# Patient Record
Sex: Male | Born: 1975 | Race: Black or African American | Hispanic: No | Marital: Married | Smoking: Current every day smoker
Health system: Southern US, Community
[De-identification: ages and names within clinical notes are randomized; demographics above are authoritative.]

---

## 2006-09-02 ENCOUNTER — Emergency Department: Payer: Self-pay | Admitting: General Practice

## 2009-01-13 ENCOUNTER — Emergency Department: Payer: Self-pay | Admitting: Internal Medicine

## 2009-05-18 ENCOUNTER — Emergency Department: Payer: Self-pay | Admitting: Emergency Medicine

## 2009-10-24 ENCOUNTER — Emergency Department: Payer: Self-pay | Admitting: Emergency Medicine

## 2009-12-12 ENCOUNTER — Ambulatory Visit: Payer: Self-pay | Admitting: Internal Medicine

## 2009-12-30 ENCOUNTER — Ambulatory Visit: Payer: Self-pay | Admitting: Internal Medicine

## 2010-01-09 ENCOUNTER — Ambulatory Visit: Payer: Self-pay | Admitting: Internal Medicine

## 2010-03-09 ENCOUNTER — Ambulatory Visit: Payer: Self-pay | Admitting: Otolaryngology

## 2011-06-27 ENCOUNTER — Ambulatory Visit: Payer: Self-pay | Admitting: Family Medicine

## 2011-07-18 ENCOUNTER — Emergency Department: Payer: Self-pay | Admitting: Emergency Medicine

## 2012-02-21 ENCOUNTER — Emergency Department: Payer: Self-pay | Admitting: Emergency Medicine

## 2012-02-21 LAB — COMPREHENSIVE METABOLIC PANEL
Alkaline Phosphatase: 54 U/L (ref 50–136)
BUN: 15 mg/dL (ref 7–18)
Bilirubin,Total: 0.7 mg/dL (ref 0.2–1.0)
Calcium, Total: 8.9 mg/dL (ref 8.5–10.1)
Co2: 27 mmol/L (ref 21–32)
EGFR (Non-African Amer.): >60
Osmolality: 282 (ref 275–301)
Sodium: 142 mmol/L (ref 136–145)

## 2012-02-21 LAB — URINALYSIS, COMPLETE
Bacteria: NONE SEEN
Bilirubin,UR: NEGATIVE
Glucose,UR: NEGATIVE mg/dL (ref 0–75)
Ketone: NEGATIVE
Leukocyte Esterase: NEGATIVE
Protein: NEGATIVE
RBC,UR: 1 /HPF (ref 0–5)
Squamous Epithelial: 1
WBC UR: 1 /HPF (ref 0–5)

## 2012-02-21 LAB — CBC
HCT: 44.1 % (ref 40.0–52.0)
HGB: 13.4 g/dL (ref 13.0–18.0)
MCH: 24.6 pg — ABNORMAL LOW (ref 26.0–34.0)
MCHC: 30.4 g/dL — ABNORMAL LOW (ref 32.0–36.0)
MCV: 81 fL (ref 80–100)
Platelet: 180 10*3/uL (ref 150–440)
RDW: 13.5 % (ref 11.5–14.5)

## 2012-02-21 LAB — LIPASE, BLOOD: Lipase: 140 U/L (ref 73–393)

## 2014-02-19 ENCOUNTER — Emergency Department: Payer: Self-pay | Admitting: Emergency Medicine

## 2015-12-14 ENCOUNTER — Emergency Department
Admission: EM | Admit: 2015-12-14 | Discharge: 2015-12-14 | Disposition: A | Discharge status: Home / Self Care | Payer: 59 | Attending: Emergency Medicine | Admitting: Emergency Medicine

## 2015-12-14 DIAGNOSIS — M7551 Bursitis of right shoulder: Secondary | ICD-10-CM | POA: Insufficient documentation

## 2015-12-14 DIAGNOSIS — M25511 Pain in right shoulder: Secondary | ICD-10-CM | POA: Diagnosis present

## 2015-12-14 MED ORDER — NAPROXEN 500 MG PO TABS: 500.0000 mg | ORAL_TABLET | Freq: Two times a day (BID) | ORAL | Status: DC

## 2015-12-14 MED ORDER — CYCLOBENZAPRINE HCL 10 MG PO TABS: 10.0000 mg | ORAL_TABLET | Freq: Three times a day (TID) | ORAL | Status: DC | PRN

## 2015-12-14 NOTE — ED Notes (Signed)
Pt states for past  Couple of weeks right side neck and pain and shoulder pain. Denies injury increased pain with movement

## 2015-12-14 NOTE — ED Provider Notes (Signed)
[facility] Emergency Department Provider Note  ____________________________________________  Time seen: Approximately 9:28 AM  I have reviewed the triage vital signs and the nursing notes.   HISTORY  Chief Complaint Neck Pain    HPI Nicholas Solis is a 40 y.o. male , NAD, presents to the emergency department with 2 weeks of right shoulder pain that radiates to the right side of the neck. Denies any falls, injury or trauma associated with the start of the pain. States the pain worsens when he lays on his right side. Pain improves as he begins to move the right extremity about the shoulder. Feels the pain is deep in his shoulder joint. Has not taken anything over-the-counter for his pain at this time nor has been evaluated. Has not noted any bruising, skin sores, redness or swelling.Has not had any numbness, weakness, tingling of the right upper extremity. Has not lost any range of motion of his neck nor has had any stiffness. Denies chest pain, shortness breath, visual changes nor back pain.   No past medical history on file.  There are no active problems to display for this patient.   No past surgical history on file.  Current Outpatient Rx  Name  Route  Sig  Dispense  Refill  . cyclobenzaprine (FLEXERIL) 10 MG tablet   Oral   Take 1 tablet (10 mg total) by mouth 3 (three) times daily as needed for muscle spasms.   21 tablet   0   . naproxen (NAPROSYN) 500 MG tablet   Oral   Take 1 tablet (500 mg total) by mouth 2 (two) times daily with a meal.   14 tablet   0     Allergies Review of patient's allergies indicates no known allergies.  No family history on file.  Social History Social History  Substance Use Topics  . Smoking status: Not on file  . Smokeless tobacco: Not on file  . Alcohol Use: Not on file     Review of Systems  Constitutional: No fever/chills Eyes: No visual changes.  Cardiovascular: No chest pain. Respiratory:   No shortness of breath.  Gastrointestinal: No abdominal pain.  No nausea, vomiting. Musculoskeletal: Positive right shoulder pain and right neck pain. Negative for back pain.  Skin: Negative for rash, redness, swelling, bruising, skin sores. Neurological: Negative for headaches, focal weakness or numbness. No tingling. 10-point ROS otherwise negative.  ____________________________________________   PHYSICAL EXAM:  VITAL SIGNS: ED Triage Vitals  Enc Vitals Group     BP 12/14/15 0853 136/74 mmHg     Pulse Rate 12/14/15 0853 92     Resp 12/14/15 0853 20     Temp 12/14/15 0853 98.7 F (37.1 C)     Temp Source 12/14/15 0853 Oral     SpO2 12/14/15 0853 97 %     Weight 12/14/15 0853 275 lb (124.739 kg)     Height 12/14/15 0853  (1.88 m)     Head Cir --      Peak Flow --      Pain Score 12/14/15 0854 10     Pain Loc --      Pain Edu? --      Excl. in GC? --      Constitutional: Alert and oriented. Well appearing and in no acute distress. Eyes: Conjunctivae are normal.   Head: Atraumatic. Neck: No cervical spine tenderness to palpation. Supple with full range of motion. Hematological/Lymphatic/Immunilogical: No cervical lymphadenopathy. Cardiovascular: Normal rate, regular rhythm.  Normal S1 and S2.  Good peripheral circulation. Respiratory: Normal respiratory effort without tachypnea or retractions. Lungs CTAB. Gastrointestinal: Soft and nontender. No distention. No CVA tenderness. Musculoskeletal: Positive Neer's sign on the right. Tenderness to deep palpation over the right anterior shoulder joint. Patient reports pain about the anterior right shoulder with abduction to 90. Appley test causes pain about anterior right shoulder. No edema.  No joint effusions. Neurologic:  Normal speech and language. No gross focal neurologic deficits are appreciated. Sensation about the right upper extremity grossly intact to light touch. Skin:  Skin is warm, dry and intact. No rash, skin  sores, redness, swelling noted. Psychiatric: Mood and affect are normal. Speech and behavior are normal. Patient exhibits appropriate insight and judgement.   ____________________________________________   LABS  None ____________________________________________  EKG  None ____________________________________________  RADIOLOGY  None ____________________________________________    PROCEDURES  Procedure(s) performed: None    Medications - No data to display   ____________________________________________   INITIAL IMPRESSION / ASSESSMENT AND PLAN / ED COURSE  Patient's diagnosis is consistent with acute right shoulder bursitis. Patient will be discharged home with prescriptions for naproxen and Flexeril to take as directed. Patient advised to apply warm heat to the affected area 20 minutes 3-4 times daily as needed. Patient should complete range of motion exercises as discussed and modeled 3-4 times daily. Patient is to follow-up with Dr. Rosita KeaMenz in orthopedics in 1 week if no better. Patient is given ED precautions to return to the ED for any worsening or new symptoms.      ____________________________________________  FINAL CLINICAL IMPRESSION(S) / ED DIAGNOSES  Final diagnoses:  Acute shoulder bursitis, right      NEW MEDICATIONS STARTED DURING THIS VISIT:  New Prescriptions   CYCLOBENZAPRINE (FLEXERIL) 10 MG TABLET    Take 1 tablet (10 mg total) by mouth 3 (three) times daily as needed for muscle spasms.   NAPROXEN (NAPROSYN) 500 MG TABLET    Take 1 tablet (500 mg total) by mouth 2 (two) times daily with a meal.         Hope PigeonJami L Hagler, PA-C 12/14/15 [id number]  Jene Everyobert Kinner, MD 12/14/15 1126

## 2015-12-14 NOTE — Discharge Instructions (Signed)
Bursitis Bursitis is inflammation and irritation of a bursa, which is one of the small, fluid-filled sacs that cushion and protect the moving parts of your body. These sacs are located between bones and muscles, muscle attachments, or skin areas next to bones. A bursa protects these structures from the wear and tear that results from frequent movement. An inflamed bursa causes pain and swelling. Fluid may build up inside the sac. Bursitis is most common near joints, especially the knees, elbows, hips, and shoulders. CAUSES Bursitis can be caused by:   Injury from:  A direct blow, like falling on your knee or elbow.  Overuse of a joint (repetitive stress).  Infection. This can happen if bacteria gets into a bursa through a cut or scrape near a joint.  Diseases that cause joint inflammation, such as gout and rheumatoid arthritis. RISK FACTORS You may be at risk for bursitis if you:   Have a job or hobby that involves a lot of repetitive stress on your joints.  Have a condition that weakens your body's defense system (immune system), such as diabetes, cancer, or HIV.  Lift and reach overhead often.  Kneel or lean on hard surfaces often.  Run or walk often. SIGNS AND SYMPTOMS The most common signs and symptoms of bursitis are:  Pain that gets worse when you move the affected body part or put weight on it.  Inflammation.  Stiffness. Other signs and symptoms may include:  Redness.  Tenderness.  Warmth.  Pain that continues after rest.  Fever and chills. This may occur in bursitis caused by infection. DIAGNOSIS Bursitis may be diagnosed by:   Medical history and physical exam.  MRI.  A procedure to drain fluid from the bursa with a needle (aspiration). The fluid may be checked for signs of infection or gout.  Blood tests to rule out other causes of inflammation. TREATMENT  Bursitis can usually be treated at home with rest, ice, compression, and elevation (RICE). For  mild bursitis, RICE treatment may be all you need. Other treatments may include:  Nonsteroidal anti-inflammatory drugs (NSAIDs) to treat pain and inflammation.  Corticosteroids to fight inflammation. You may have these drugs injected into and around the area of bursitis.  Aspiration of bursitis fluid to relieve pain and improve movement.  Antibiotic medicine to treat an infected bursa.  A splint, brace, or walking aid.  Physical therapy if you continue to have pain or limited movement.  Surgery to remove a damaged or infected bursa. This may be needed if you have a very bad case of bursitis or if other treatments have not worked. HOME CARE INSTRUCTIONS   Take medicines only as directed by your health care provider.  If you were prescribed an antibiotic medicine, finish it all even if you start to feel better.  Rest the affected area as directed by your health care provider.  Keep the area elevated.  Avoid activities that make pain worse.  Apply ice to the injured area:  Place ice in a plastic bag.  Place a towel between your skin and the bag.  Leave the ice on for 20 minutes, 2-3 times a day.  Use splints, braces, pads, or walking aids as directed by your health care provider.  Keep all follow-up visits as directed by your health care provider. This is important. PREVENTION   Wear knee pads if you kneel often.  Wear sturdy running or walking shoes that fit you well.  Take regular breaks from repetitive activity.  Warm  up by stretching before doing any strenuous activity.  Maintain a healthy weight or lose weight as recommended by your health care provider. Ask your health care provider if you need help.  Exercise regularly. Start any new physical activity gradually. SEEK MEDICAL CARE IF:   Your bursitis is not responding to treatment or home care.  You have a fever.  You have chills.   This information is not intended to replace advice given to you by your  health care provider. Make sure you discuss any questions you have with your health care provider.   Document Released: 07/23/2000 Document Revised: 04/16/2015 Document Reviewed: 10/15/2013 Elsevier Interactive Patient Education 2016 [employer] Inc[employer]  [employer] LockerHeat Therapy Heat therapy can help ease sore, stiff, injured, and tight muscles and joints. Heat relaxes your muscles, which may help ease your pain. Heat therapy should only be used on old, pre-existing, or long-lasting (chronic) injuries. Do not use heat therapy unless told by your doctor. HOW TO USE HEAT THERAPY There are several different kinds of heat therapy, including:  Moist heat pack.  Warm water bath.  Hot water bottle.  Electric heating pad.  Heated gel pack.  Heated wrap.  Electric heating pad. GENERAL HEAT THERAPY RECOMMENDATIONS   Do not sleep while using heat therapy. Only use heat therapy while you are awake.  Your skin may turn pink while using heat therapy. Do not use heat therapy if your skin turns red.  Do not use heat therapy if you have new pain.  High heat or long exposure to heat can cause burns. Be careful when using heat therapy to avoid burning your skin.  Do not use heat therapy on areas of your skin that are already irritated, such as with a rash or sunburn. GET HELP IF:   You have blisters, redness, swelling (puffiness), or numbness.  You have new pain.  Your pain is worse. MAKE SURE YOU:  Understand these instructions.  Will watch your condition.  Will get help right away if you are not doing well or get worse.   This information is not intended to replace advice given to you by your health care provider. Make sure you discuss any questions you have with your health care provider.   Document Released: 10/18/2011 Document Revised: 08/16/2014 Document Reviewed: 09/18/2013 Elsevier Interactive Patient Education 2016 Elsevier Inc[employer]  Generic Shoulder Exercises EXERCISES  RANGE OF MOTION  (ROM) AND STRETCHING EXERCISES These exercises may help you when beginning to rehabilitate your injury. Your symptoms may resolve with or without further involvement from your physician, physical therapist or athletic trainer. While completing these exercises, remember:   Restoring tissue flexibility helps normal motion to return to the joints. This allows healthier, less painful movement and activity.  An effective stretch should be held for at least 30 seconds.  A stretch should never be painful. You should only feel a gentle lengthening or release in the stretched tissue. ROM - Pendulum  Bend at the waist so that your right / left arm falls away from your body. Support yourself with your opposite hand on a solid surface, such as a table or a countertop.  Your right / left arm should be perpendicular to the ground. If it is not perpendicular, you need to lean over farther. Relax the muscles in your right / left arm and shoulder as much as possible.  Gently sway your hips and trunk so they move your right / left arm without any use of your right / left shoulder  muscles.  Progress your movements so that your right / left arm moves side to side, then forward and backward, and finally, both clockwise and counterclockwise.  Complete __________ repetitions in each direction. Many people use this exercise to relieve discomfort in their shoulder as well as to gain range of motion. Repeat __________ times. Complete this exercise __________ times per day. STRETCH - Flexion, Standing  Stand with good posture. With an underhand grip on your right / left hand and an overhand grip on the opposite hand, grasp a broomstick or cane so that your hands are a little more than shoulder-width apart.  Keeping your right / left elbow straight and shoulder muscles relaxed, push the stick with your opposite hand to raise your right / left arm in front of your body and then overhead. Raise your arm until you feel a  stretch in your right / left shoulder, but before you have increased shoulder pain.  Try to avoid shrugging your right / left shoulder as your arm rises by keeping your shoulder blade tucked down and toward your mid-back spine. Hold __________ seconds.  Slowly return to the starting position. Repeat __________ times. Complete this exercise __________ times per day. STRETCH - Internal Rotation  Place your right / left hand behind your back, palm-up.  Throw a towel or belt over your opposite shoulder. Grasp the towel/belt with your right / left hand.  While keeping an upright posture, gently pull up on the towel/belt until you feel a stretch in the front of your right / left shoulder.  Avoid shrugging your right / left shoulder as your arm rises by keeping your shoulder blade tucked down and toward your mid-back spine.  Hold __________. Release the stretch by lowering your opposite hand. Repeat __________ times. Complete this exercise __________ times per day. STRETCH - External Rotation and Abduction  Stagger your stance through a doorframe. It does not matter which foot is forward.  As instructed by your physician, physical therapist or athletic trainer, place your hands:  And forearms above your head and on the door frame.  And forearms at head-height and on the door frame.  At elbow-height and on the door frame.  Keeping your head and chest upright and your stomach muscles tight to prevent over-extending your low-back, slowly shift your weight onto your front foot until you feel a stretch across your chest and/or in the front of your shoulders.  Hold __________ seconds. Shift your weight to your back foot to release the stretch. Repeat __________ times. Complete this stretch __________ times per day.  STRENGTHENING EXERCISES  These exercises may help you when beginning to rehabilitate your injury. They may resolve your symptoms with or without further involvement from your  physician, physical therapist or athletic trainer. While completing these exercises, remember:   Muscles can gain both the endurance and the strength needed for everyday activities through controlled exercises.  Complete these exercises as instructed by your physician, physical therapist or athletic trainer. Progress the resistance and repetitions only as guided.  You may experience muscle soreness or fatigue, but the pain or discomfort you are trying to eliminate should never worsen during these exercises. If this pain does worsen, stop and make certain you are following the directions exactly. If the pain is still present after adjustments, discontinue the exercise until you can discuss the trouble with your clinician.  If advised by your physician, during your recovery, avoid activity or exercises which involve actions that place your right /  left hand or elbow above your head or behind your back or head. These positions stress the tissues which are trying to heal. STRENGTH - Scapular Depression and Adduction  With good posture, sit on a firm chair. Supported your arms in front of you with pillows, arm rests or a table top. Have your elbows in line with the sides of your body.  Gently draw your shoulder blades down and toward your mid-back spine. Gradually increase the tension without tensing the muscles along the top of your shoulders and the back of your neck.  Hold for __________ seconds. Slowly release the tension and relax your muscles completely before completing the next repetition.  After you have practiced this exercise, remove the arm support and complete it in standing as well as sitting. Repeat __________ times. Complete this exercise __________ times per day.  STRENGTH - External Rotators  Secure a rubber exercise band/tubing to a fixed object so that it is at the same height as your right / left elbow when you are standing or sitting on a firm surface.  Stand or sit so that  the secured exercise band/tubing is at your side that is not injured.  Bend your elbow 90 degrees. Place a folded towel or small pillow under your right / left arm so that your elbow is a few inches away from your side.  Keeping the tension on the exercise band/tubing, pull it away from your body, as if pivoting on your elbow. Be sure to keep your body steady so that the movement is only coming from your shoulder rotating.  Hold __________ seconds. Release the tension in a controlled manner as you return to the starting position. Repeat __________ times. Complete this exercise __________ times per day.  STRENGTH - Supraspinatus  Stand or sit with good posture. Grasp a __________ weight or an exercise band/tubing so that your hand is "thumbs-up," like when you shake hands.  Slowly lift your right / left hand from your thigh into the air, traveling about 30 degrees from straight out at your side. Lift your hand to shoulder height or as far as you can without increasing any shoulder pain. Initially, many people do not lift their hands above shoulder height.  Avoid shrugging your right / left shoulder as your arm rises by keeping your shoulder blade tucked down and toward your mid-back spine.  Hold for __________ seconds. Control the descent of your hand as you slowly return to your starting position. Repeat __________ times. Complete this exercise __________ times per day.  STRENGTH - Shoulder Extensors  Secure a rubber exercise band/tubing so that it is at the height of your shoulders when you are either standing or sitting on a firm arm-less chair.  With a thumbs-up grip, grasp an end of the band/tubing in each hand. Straighten your elbows and lift your hands straight in front of you at shoulder height. Step back away from the secured end of band/tubing until it becomes tense.  Squeezing your shoulder blades together, pull your hands down to the sides of your thighs. Do not allow your hands to  go behind you.  Hold for __________ seconds. Slowly ease the tension on the band/tubing as you reverse the directions and return to the starting position. Repeat __________ times. Complete this exercise __________ times per day.  STRENGTH - Scapular Retractors  Secure a rubber exercise band/tubing so that it is at the height of your shoulders when you are either standing or sitting on a firm  arm-less chair.  With a palm-down grip, grasp an end of the band/tubing in each hand. Straighten your elbows and lift your hands straight in front of you at shoulder height. Step back away from the secured end of band/tubing until it becomes tense.  Squeezing your shoulder blades together, draw your elbows back as you bend them. Keep your upper arm lifted away from your body throughout the exercise.  Hold __________ seconds. Slowly ease the tension on the band/tubing as you reverse the directions and return to the starting position. Repeat __________ times. Complete this exercise __________ times per day. STRENGTH - Scapular Depressors  Find a sturdy chair without wheels, such as a from a dining room table.  Keeping your feet on the floor, lift your bottom from the seat and lock your elbows.  Keeping your elbows straight, allow gravity to pull your body weight down. Your shoulders will rise toward your ears.  Raise your body against gravity by drawing your shoulder blades down your back, shortening the distance between your shoulders and ears. Although your feet should always maintain contact with the floor, your feet should progressively support less body weight as you get stronger.  Hold __________ seconds. In a controlled and slow manner, lower your body weight to begin the next repetition. Repeat __________ times. Complete this exercise __________ times per day.    This information is not intended to replace advice given to you by your health care provider. Make sure you discuss any questions you  have with your health care provider.   Document Released: 06/09/2005 Document Revised: 08/16/2014 Document Reviewed: 11/07/2008 Elsevier Interactive Patient Education [employer]

## 2016-05-18 ENCOUNTER — Encounter: Payer: Self-pay | Admitting: Emergency Medicine

## 2016-05-18 ENCOUNTER — Emergency Department
Admission: EM | Admit: 2016-05-18 | Discharge: 2016-05-18 | Disposition: A | Discharge status: Home / Self Care | Payer: Worker's Compensation | Attending: Emergency Medicine | Admitting: Emergency Medicine

## 2016-05-18 ENCOUNTER — Emergency Department: Payer: Worker's Compensation

## 2016-05-18 DIAGNOSIS — Y990 Civilian activity done for income or pay: Secondary | ICD-10-CM | POA: Insufficient documentation

## 2016-05-18 DIAGNOSIS — Y929 Unspecified place or not applicable: Secondary | ICD-10-CM | POA: Insufficient documentation

## 2016-05-18 DIAGNOSIS — F17200 Nicotine dependence, unspecified, uncomplicated: Secondary | ICD-10-CM | POA: Insufficient documentation

## 2016-05-18 DIAGNOSIS — Y9389 Activity, other specified: Secondary | ICD-10-CM | POA: Diagnosis not present

## 2016-05-18 DIAGNOSIS — S8391XA Sprain of unspecified site of right knee, initial encounter: Secondary | ICD-10-CM | POA: Insufficient documentation

## 2016-05-18 DIAGNOSIS — X501XXA Overexertion from prolonged static or awkward postures, initial encounter: Secondary | ICD-10-CM | POA: Diagnosis not present

## 2016-05-18 DIAGNOSIS — S8991XA Unspecified injury of right lower leg, initial encounter: Secondary | ICD-10-CM | POA: Diagnosis present

## 2016-05-18 MED ORDER — IBUPROFEN 600 MG PO TABS
600.0000 mg | ORAL_TABLET | Freq: Four times a day (QID) | ORAL | 0 refills | Status: AC | PRN
Start: 2016-05-18 — End: ?

## 2016-05-18 NOTE — ED Provider Notes (Signed)
[facility] Emergency Department Provider Note  ____________________________________________  Time seen: Approximately 8:47 AM  I have reviewed the triage vital signs and the nursing notes.   HISTORY  Chief Complaint Knee Injury    HPI Nicholas Solis is a 40 y.o. male , NAD, presents to the emergency department with one-day history of right knee pain. States he was working yesterday and his right foot slipped off of a curb causing his right knee to buckle. Denies fall or blunt trauma to the knee. Has not had any numbness, wheezes, tingling about the right lower extremity. Has not noted any swelling, redness or abnormal warmth to the knee or lower extremity. States he was able to finish his shift and while he was at home he felt his right knee "lock". Had difficulty straightening the knee without significant pain. Woke this morning with a burning sensation about the lateral portion of the right knee and difficulty straightening the knee. States the pain and range of motion has improved since waking this morning. No previous trauma to the knee. No fevers or chills. Has not completed any supportive care.   History reviewed. No pertinent past medical history.  There are no active problems to display for this patient.   History reviewed. No pertinent surgical history.  Prior to Admission medications   Medication Sig Start Date End Date Taking? Authorizing Provider  ibuprofen (ADVIL,MOTRIN) 600 MG tablet Take 1 tablet (600 mg total) by mouth every 6 (six) hours as needed. 05/18/16   Sheli Dorin L Edelin Fryer, PA-C    Allergies Review of patient's allergies indicates no known allergies.  No family history on file.  Social History Social History  Substance Use Topics  . Smoking status: Current Every Day Smoker  . Smokeless tobacco: Never Used  . Alcohol use Not on file     Review of Systems  Constitutional: No fever/chills Musculoskeletal: Positive right knee  pain with occasional "locking", decreased range of motion.  Skin: Negative for redness, swelling, abnormal warmth. Neurological: Negative for numbness, weakness, tingling.   ____________________________________________   PHYSICAL EXAM:  VITAL SIGNS: ED Triage Vitals  Enc Vitals Group     BP 05/18/16 0817 (!) 149/86     Pulse Rate 05/18/16 0817 71     Resp 05/18/16 0817 16     Temp 05/18/16 0817 98.3 F (36.8 C)     Temp Source 05/18/16 0817 Oral     SpO2 05/18/16 0817 99 %     Weight 05/18/16 0812 265 lb (120.2 kg)     Height 05/18/16 0812 6\' 2"  (1.88 m)     Head Circumference --      Peak Flow --      Pain Score 05/18/16 0812 6     Pain Loc --      Pain Edu? --      Excl. in GC? --      Constitutional: Alert and oriented. Well appearing and in no acute distress. Eyes: Conjunctivae are normal. Head: Atraumatic. Cardiovascular: Good peripheral circulation with 2+ pulses noted in the right lower extremity. Respiratory: Normal respiratory effort without tachypnea or retractions.  Musculoskeletal: Tenderness to deep palpation of the right lateral knee without bony abnormalities, step-offs or crepitus. No laxity with varus or valgus stress. No laxity with anterior or posterior drawer. Full flexion and can be achieved without pain. Extension to approximately 160 but stopped due to pain. Negative patellofemoral grinding. No lower extremity tenderness nor edema.  No joint effusions. Neurologic:  Normal speech and language. No gross focal neurologic deficits are appreciated.  Skin:  Skin is warm, dry and intact. No rash, redness, abnormal warmth, skin sores noted. Psychiatric: Mood and affect are normal. Speech and behavior are normal. Patient exhibits appropriate insight and judgement.   ____________________________________________   LABS  None ____________________________________________  EKG  None ____________________________________________  RADIOLOGY I, Madasyn Heath L  Adia Crammer, personally viewed and evaluated these images (pHope Pigeonlain radiographs) as part of my medical decision making, as well as reviewing the written report by the radiologist.  Dg Knee Complete 4 Views Right  Result Date: 05/18/2016 CLINICAL DATA:  Reports twisting right knee at work yesterday with lateral side pain and swelling. Difficult to bear weight. EXAM: RIGHT KNEE - COMPLETE 4+ VIEW COMPARISON:  None. FINDINGS: The joint is located. Bony mineralization is normal. No definite joint effusion visualize. Negative for fracture or focal bony lesion. No significant degenerative changes. Question mild subcutaneous swelling superior to the patella and lateral to the joint. IMPRESSION: No acute bony abnormality or joint effusion identified. Question mild subcutaneous swelling about the knee. Electronically Signed   By: Britta MccreedySusan  Turner M.D.   On: 05/18/2016 09:19    ____________________________________________    PROCEDURES  Procedure(s) performed: None   Procedures   Medications - No data to display   ____________________________________________   INITIAL IMPRESSION / ASSESSMENT AND PLAN / ED COURSE  Pertinent labs & imaging results that were available during my care of the patient were reviewed by me and considered in my medical decision making (see chart for details).  Clinical Course    Patient's diagnosis is consistent with sprain of right knee. Patient will be discharged home with prescriptions for Naproxen to take as directed. Light ROM exercises as tolerated. Patient was placed in an ace wrap and given crutches for supportive care. Given a work note for light duty or sit down duty for 2 days. If unable to return to work full duty in 2 days should follow up with outpatient urgent care or orthopedics as referred by [employer]. Patient is given ED precautions to return to the ED for any worsening or new symptoms.    ____________________________________________  FINAL  CLINICAL IMPRESSION(S) / ED DIAGNOSES  Final diagnoses:  Sprain of right knee, unspecified ligament, initial encounter      NEW MEDICATIONS STARTED DURING THIS VISIT:  New Prescriptions   IBUPROFEN (ADVIL,MOTRIN) 600 MG TABLET    Take 1 tablet (600 mg total) by mouth every 6 (six) hours as needed.         Hope PigeonJami L Jeanita Carneiro, PA-C 05/18/16 0940    Jeanmarie PlantJames A McShane, MD 05/18/16 [phone]

## 2016-05-18 NOTE — Discharge Instructions (Signed)
May take knee wrap off at night. Do not apply the wrap too tight, should only be supportive.   Ice x 20 minutes 3-4 times daily.   Should be able to walk without crutches within 2 days, if not, please see orthopedics.   Will need to see an outpatient urgent care for follow up if unable to return to work without restrictions in 2 days.

## 2016-05-18 NOTE — ED Notes (Addendum)
Multiple attempts to contact pt employer to get information if UDS is needed @ [phone] Ext. 3, [employer] is not available at present..Marland Kitchen

## 2016-05-18 NOTE — ED Notes (Signed)
Spoke with [employer] and was given the number for there Human resources department.. The office is currently closed and was unable to get needed information for workers comp testing prior to discharging the pt.

## 2016-05-18 NOTE — ED Triage Notes (Signed)
Reports twisting right knee at work yesterday.

## 2017-11-25 IMAGING — DX DG KNEE COMPLETE 4+V*R*
4 series · 4 of 4 positions shown · non-contrast
Comparison: None.

CLINICAL DATA: Reports twisting right knee at work yesterday with
lateral side pain and swelling. Difficult to bear weight.

EXAM:
RIGHT KNEE - COMPLETE 4+ VIEW

[knee ap]
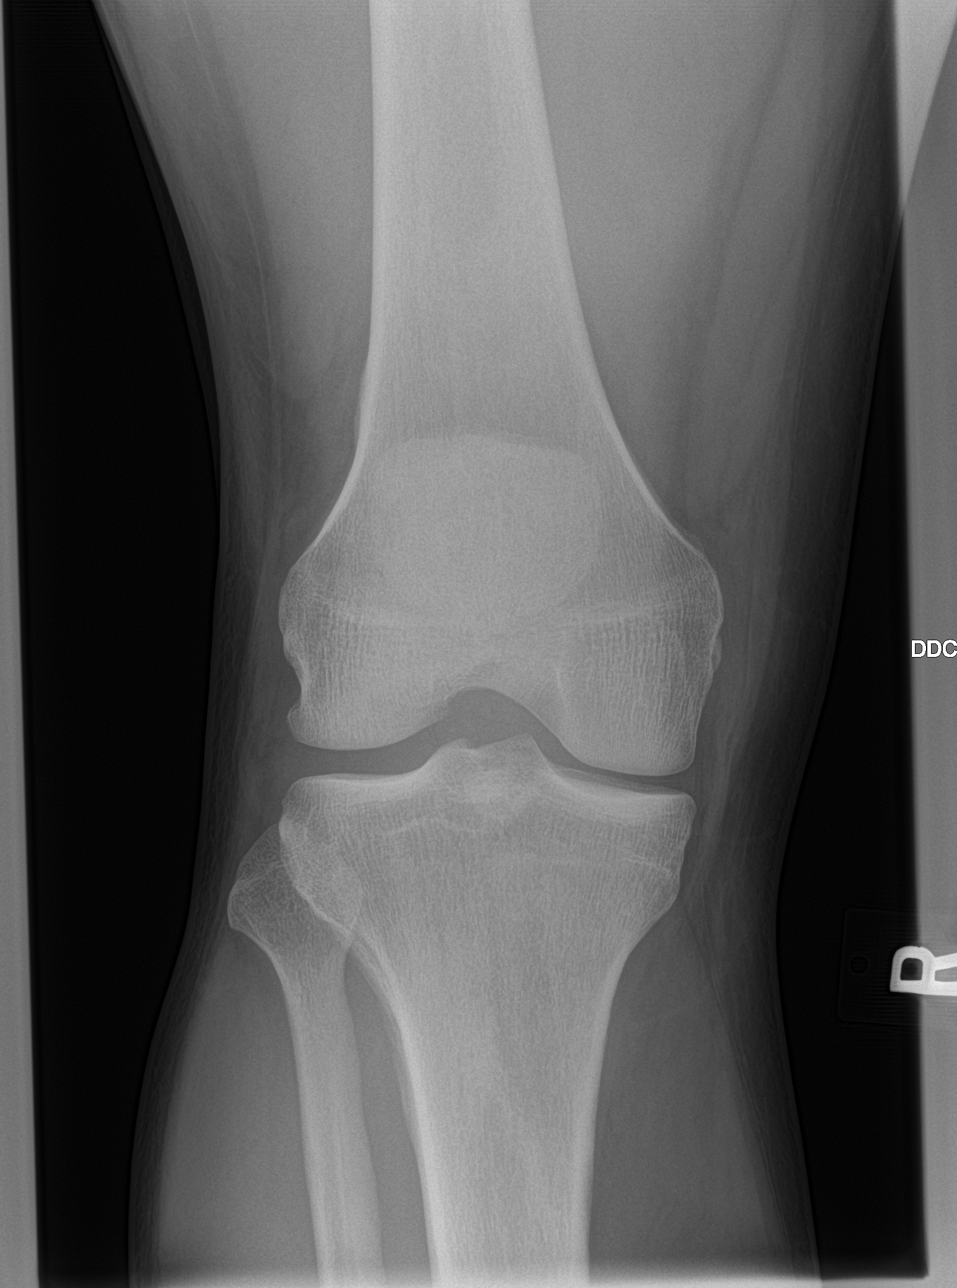

[knee tunnel]
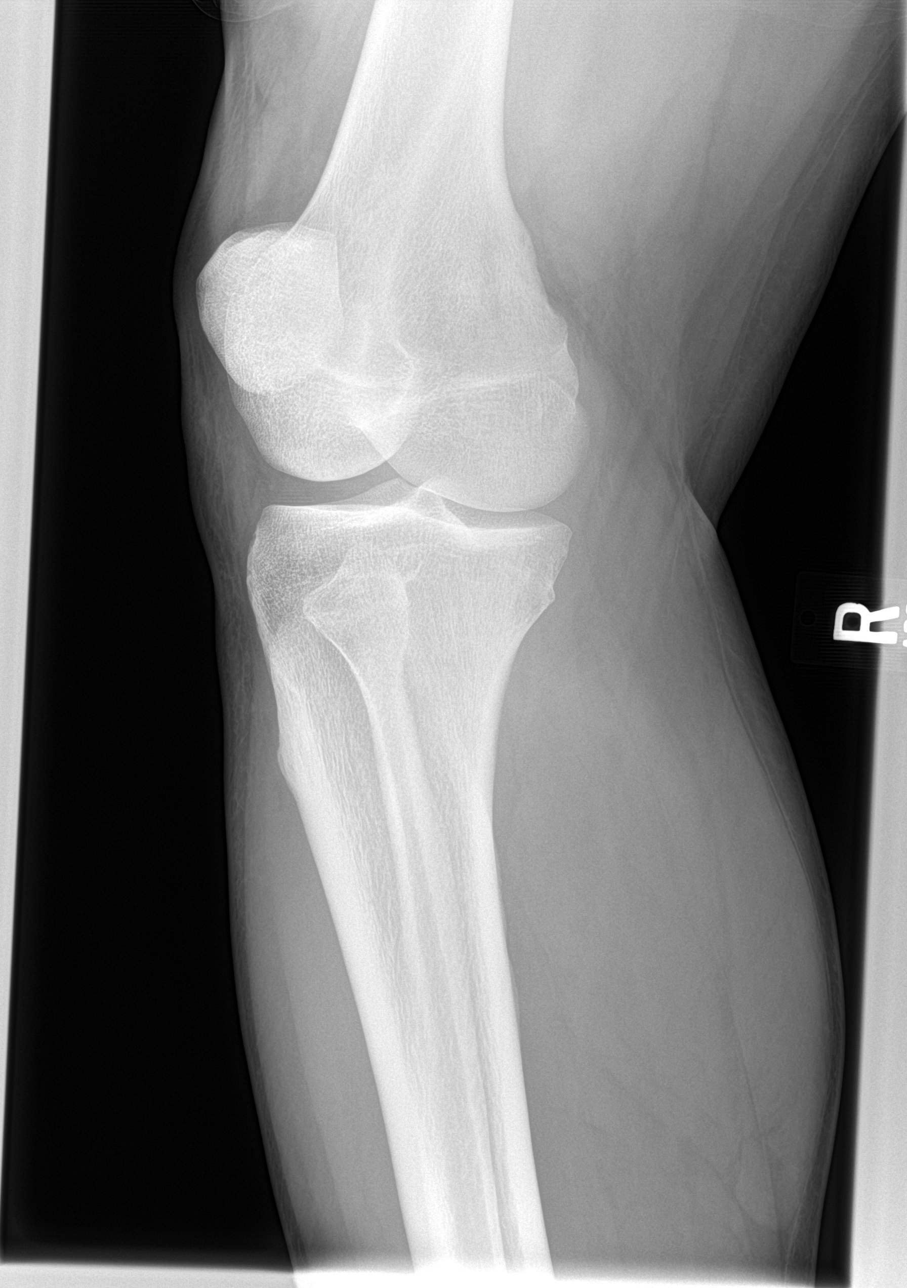

[knee lat]
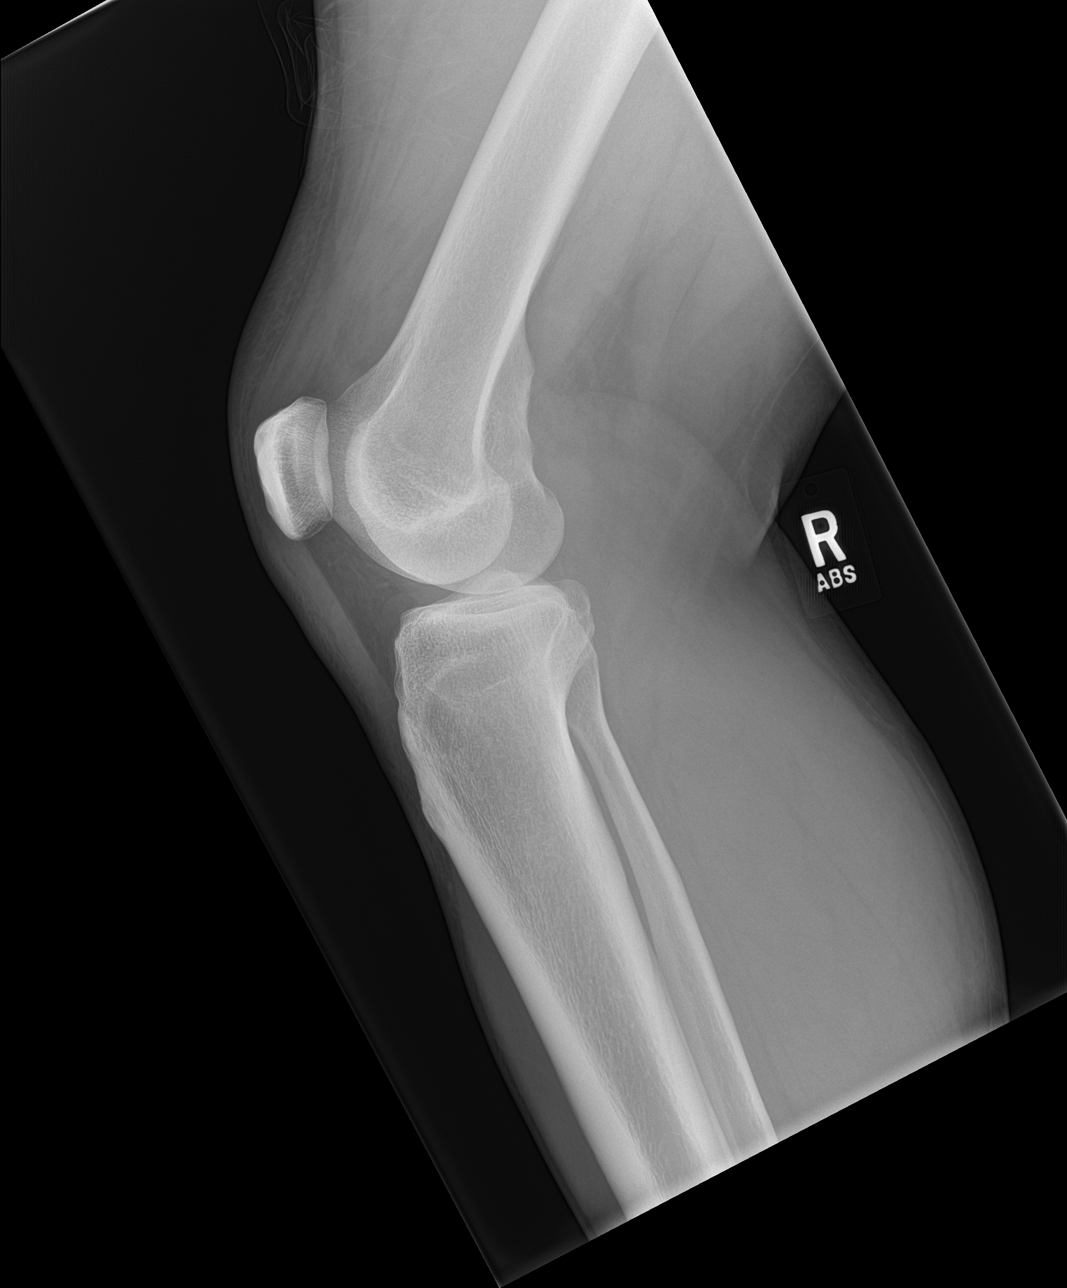

[knee obl]
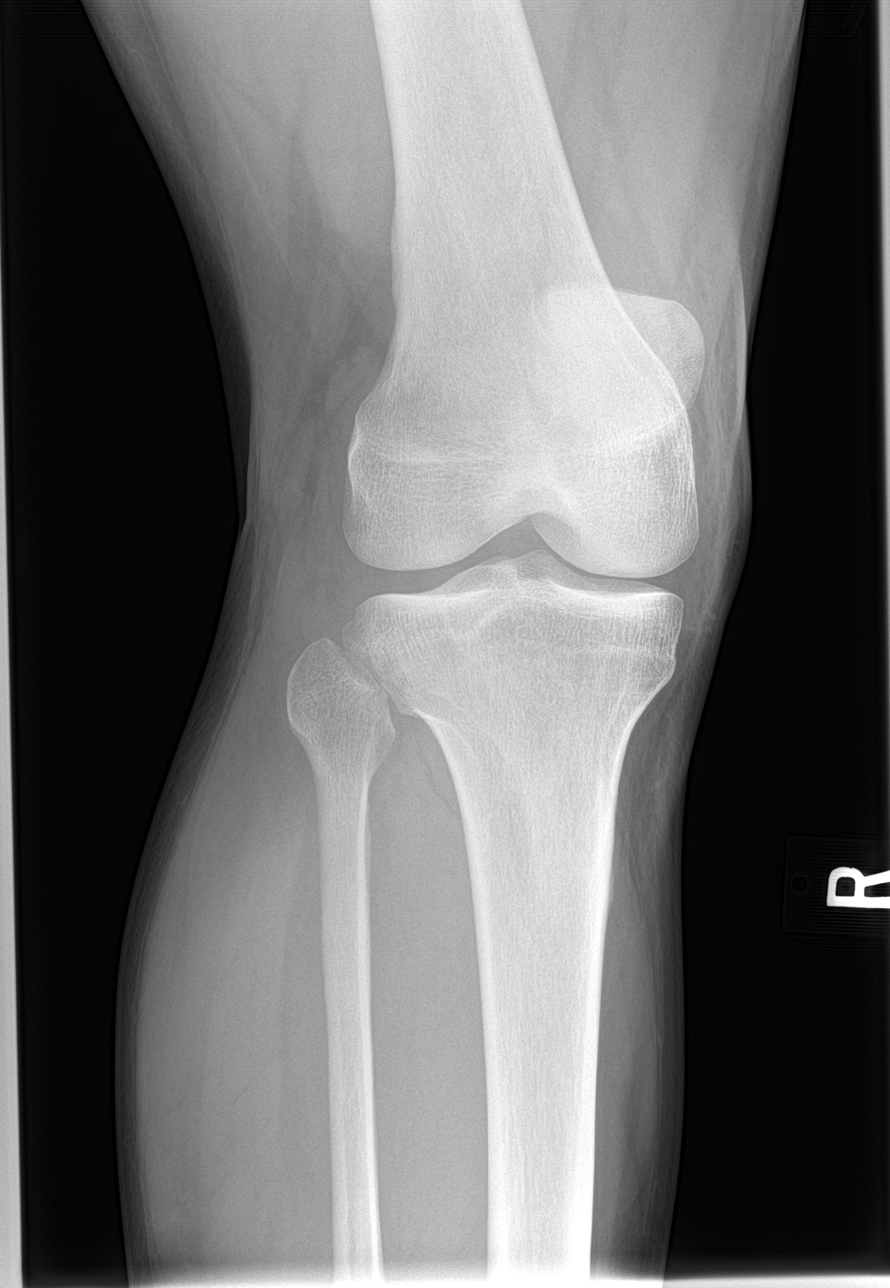

[4 of 4 positions shown; findings below may reference images not displayed]

FINDINGS: The joint is located. Bony mineralization is normal. No definite
joint effusion visualize. Negative for fracture or focal bony
lesion. No significant degenerative changes.

Question mild subcutaneous swelling superior to the patella and
lateral to the joint.
IMPRESSION: No acute bony abnormality or joint effusion identified.

Question mild subcutaneous swelling about the knee.

## 2017-12-12 ENCOUNTER — Emergency Department (HOSPITAL_COMMUNITY)
Admission: EM | Admit: 2017-12-12 | Discharge: 2017-12-13 | Disposition: A | Discharge status: Home / Self Care | Payer: 59 | Attending: Emergency Medicine | Admitting: Emergency Medicine

## 2017-12-12 ENCOUNTER — Other Ambulatory Visit: Payer: Self-pay

## 2017-12-12 DIAGNOSIS — M791 Myalgia, unspecified site: Secondary | ICD-10-CM | POA: Insufficient documentation

## 2017-12-12 DIAGNOSIS — Z5321 Procedure and treatment not carried out due to patient leaving prior to being seen by health care provider: Secondary | ICD-10-CM | POA: Diagnosis not present

## 2017-12-12 LAB — CBC
HEMATOCRIT: 43.6 % (ref 39.0–52.0)
Hemoglobin: 14.3 g/dL (ref 13.0–17.0)
MCH: 26 pg (ref 26.0–34.0)
MCHC: 32.8 g/dL (ref 30.0–36.0)
MCV: 79.1 fL (ref 78.0–100.0)
Platelets: 207 10*3/uL (ref 150–400)
RBC: 5.51 MIL/uL (ref 4.22–5.81)
RDW: 13.3 % (ref 11.5–15.5)
WBC: 9.9 10*3/uL (ref 4.0–10.5)

## 2017-12-12 LAB — URINALYSIS, ROUTINE W REFLEX MICROSCOPIC
Bilirubin Urine: NEGATIVE
GLUCOSE, UA: NEGATIVE mg/dL
Hgb urine dipstick: NEGATIVE
Ketones, ur: NEGATIVE mg/dL
LEUKOCYTES UA: NEGATIVE
Nitrite: NEGATIVE
PH: 6 (ref 5.0–8.0)
Protein, ur: NEGATIVE mg/dL
Specific Gravity, Urine: 1.001 — ABNORMAL LOW (ref 1.005–1.030)

## 2017-12-12 LAB — DIFFERENTIAL
Basophils Absolute: 0 10*3/uL (ref 0.0–0.1)
Basophils Relative: 0 %
EOS PCT: 1 %
Eosinophils Absolute: 0.1 10*3/uL (ref 0.0–0.7)
LYMPHS ABS: 3 10*3/uL (ref 0.7–4.0)
LYMPHS PCT: 30 %
MONO ABS: 0.5 10*3/uL (ref 0.1–1.0)
Monocytes Relative: 6 %
Neutro Abs: 6.2 10*3/uL (ref 1.7–7.7)
Neutrophils Relative %: 63 %

## 2017-12-12 LAB — I-STAT TROPONIN, ED: Troponin i, poc: 0 ng/mL (ref 0.00–0.08)

## 2017-12-12 NOTE — ED Triage Notes (Signed)
Patient c/o generalized body aches after getting up to use the bathroom one hour ago and states that he "just felt bad".

## 2017-12-12 NOTE — ED Notes (Signed)
In the middle of triage; patient states that he cannot wait 4-5 hours to be seen. Patient advised that I could not be able to confirm a specific timetable of when he would be seen. He states "forget this, I will just go to [facility]". Advised patient that we could begin blood work/labs while he was waiting. Patient declined.

## 2017-12-13 LAB — COMPREHENSIVE METABOLIC PANEL
ALK PHOS: 37 U/L — AB (ref 38–126)
ALT: 24 U/L (ref 17–63)
AST: 25 U/L (ref 15–41)
Albumin: 4.3 g/dL (ref 3.5–5.0)
Anion gap: 11 (ref 5–15)
BILIRUBIN TOTAL: 1.7 mg/dL — AB (ref 0.3–1.2)
BUN: 12 mg/dL (ref 6–20)
CALCIUM: 9.4 mg/dL (ref 8.9–10.3)
CO2: 27 mmol/L (ref 22–32)
Chloride: 101 mmol/L (ref 101–111)
Creatinine, Ser: 1.12 mg/dL (ref 0.61–1.24)
Glucose, Bld: 98 mg/dL (ref 65–99)
Potassium: 3.9 mmol/L (ref 3.5–5.1)
SODIUM: 139 mmol/L (ref 135–145)
TOTAL PROTEIN: 7.3 g/dL (ref 6.5–8.1)

## 2017-12-13 NOTE — ED Notes (Signed)
Patient didn't answer to assess vital signs.

## 2021-09-14 ENCOUNTER — Emergency Department
Admission: EM | Admit: 2021-09-14 | Discharge: 2021-09-14 | Disposition: A | Discharge status: Home / Self Care | Payer: 59 | Attending: Emergency Medicine | Admitting: Emergency Medicine

## 2021-09-14 ENCOUNTER — Other Ambulatory Visit: Payer: Self-pay

## 2021-09-14 DIAGNOSIS — X500XXA Overexertion from strenuous movement or load, initial encounter: Secondary | ICD-10-CM | POA: Insufficient documentation

## 2021-09-14 DIAGNOSIS — S39012A Strain of muscle, fascia and tendon of lower back, initial encounter: Secondary | ICD-10-CM | POA: Insufficient documentation

## 2021-09-14 DIAGNOSIS — M62830 Muscle spasm of back: Secondary | ICD-10-CM

## 2021-09-14 DIAGNOSIS — M5431 Sciatica, right side: Secondary | ICD-10-CM

## 2021-09-14 DIAGNOSIS — S34109A Unspecified injury to unspecified level of lumbar spinal cord, initial encounter: Secondary | ICD-10-CM | POA: Diagnosis present

## 2021-09-14 MED ORDER — ORPHENADRINE CITRATE 30 MG/ML IJ SOLN
60.0000 mg | Freq: Two times a day (BID) | INTRAMUSCULAR | Status: DC
  Administered 2021-09-14: 60 mg via INTRAVENOUS
  Filled 2021-09-14: qty 2

## 2021-09-14 MED ORDER — PREDNISONE 10 MG PO TABS: 10.0000 mg | ORAL_TABLET | Freq: Every day | ORAL | 0 refills | Status: DC

## 2021-09-14 MED ORDER — ONDANSETRON HCL 4 MG/2ML IJ SOLN
4.0000 mg | Freq: Once | INTRAMUSCULAR | Status: AC
  Administered 2021-09-14: 4 mg via INTRAVENOUS
  Filled 2021-09-14: qty 2

## 2021-09-14 MED ORDER — HYDROMORPHONE HCL 1 MG/ML IJ SOLN
1.0000 mg | Freq: Once | INTRAMUSCULAR | Status: AC
  Administered 2021-09-14: 1 mg via INTRAVENOUS
  Filled 2021-09-14: qty 1

## 2021-09-14 MED ORDER — HYDROCODONE-ACETAMINOPHEN 5-325 MG PO TABS: 1.0000 | ORAL_TABLET | ORAL | 0 refills | Status: DC | PRN

## 2021-09-14 MED ORDER — CYCLOBENZAPRINE HCL 5 MG PO TABS: 5.0000 mg | ORAL_TABLET | Freq: Three times a day (TID) | ORAL | 0 refills | Status: DC | PRN

## 2021-09-14 MED ORDER — DEXAMETHASONE SODIUM PHOSPHATE 10 MG/ML IJ SOLN
10.0000 mg | Freq: Once | INTRAMUSCULAR | Status: AC
  Administered 2021-09-14: 10 mg via INTRAVENOUS
  Filled 2021-09-14: qty 1

## 2021-09-14 NOTE — Discharge Instructions (Signed)
Please take medication as prescribed.  Avoid any heavy lifting pushing and pulling.  Return to the ER for any severe pain, weakness, loss of bowel or bladder symptoms.  Follow-up with orthopedics if no improvement in 1 week

## 2021-09-14 NOTE — ED Triage Notes (Signed)
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 Pt states 10/10.  Pt denies wishing to file for workers comp

## 2021-09-14 NOTE — ED Provider Notes (Signed)
[facility] EMERGENCY DEPARTMENT Provider Note   CSN: [id number] Arrival date & time: 09/14/21  1807     History  Chief Complaint  Patient presents with   Back Pain    Nicholas Solis is a 46 y.o. male.  Presents to the emergency department valuation of acute right lower back pain.  Patient states earlier today he was bending and lifting at work, felt pain in his lower back and burning numbness and tingling going down his right leg.  No loss of bowel or bladder symptoms.  No weakness.  He has a hard time walking because of pain in the leg because his back feels very tight.  He denies any fevers, urinary symptoms, abdominal pain chest pain or shortness of breath.  HPI     Home Medications Prior to Admission medications   Medication Sig Start Date End Date Taking? Authorizing Provider  cyclobenzaprine (FLEXERIL) 5 MG tablet Take 1-2 tablets (5-10 mg total) by mouth 3 (three) times daily as needed for muscle spasms. 09/14/21  Yes Evon Slack, PA-C  HYDROcodone-acetaminophen (NORCO) 5-325 MG tablet Take 1 tablet by mouth every 4 (four) hours as needed for moderate pain. 09/14/21  Yes Evon Slack, PA-C  predniSONE (DELTASONE) 10 MG tablet Take 1 tablet (10 mg total) by mouth daily. 6,5,4,3,2,1 six day taper 09/14/21  Yes Evon Slack, PA-C  ibuprofen (ADVIL,MOTRIN) 600 MG tablet Take 1 tablet (600 mg total) by mouth every 6 (six) hours as needed. 05/18/16   Hagler, Jami L, PA-C      Allergies    Patient has no known allergies.    Review of Systems   Review of Systems  Physical Exam Updated Vital Signs BP (!) 141/91 (BP Location: Right Arm)    Temp 98.8 F (37.1 C) (Oral)    Resp 18    Ht 6\' 2"  (1.88 m)    Wt 111.1 kg    SpO2 95%    BMI 31.46 kg/m  Physical Exam Constitutional:      Appearance: He is well-developed.  HENT:     Head: Normocephalic and atraumatic.  Eyes:     Conjunctiva/sclera: Conjunctivae normal.  Cardiovascular:     Rate  and Rhythm: Normal rate.  Pulmonary:     Effort: Pulmonary effort is normal. No respiratory distress.  Abdominal:     General: There is no distension.     Tenderness: There is no abdominal tenderness. There is no guarding.  Musculoskeletal:        General: Normal range of motion.     Cervical back: Normal range of motion.     Comments: Lumbar spine with no spinous process tenderness but mild paravertebral muscle tenderness on the right side.  No SI or sacral tenderness.  No tenderness along the iliac crest.  No sciatic notch tenderness.  He has a positive right straight leg raise test.  He has a negative logroll test and no pain with hip internal ex rotation.  He is neurovascular tact in bilateral lower extremities with good ankle plantarflexion dorsiflexion, EHL, active knee extension bilaterally.  No sensation loss throughout the lower extremities.  Skin:    General: Skin is warm.     Findings: No rash.  Neurological:     Mental Status: He is alert and oriented to person, place, and time.  Psychiatric:        Behavior: Behavior normal.        Thought Content: Thought content normal.  ED Results / Procedures / Treatments   Labs (all labs ordered are listed, but only abnormal results are displayed) Labs Reviewed - No data to display  EKG None  Radiology No results found.  Procedures Procedures    Medications Ordered in ED Medications  orphenadrine (NORFLEX) injection 60 mg (60 mg Intravenous Given 09/14/21 2038)  HYDROmorphone (DILAUDID) injection 1 mg (1 mg Intravenous Given 09/14/21 2038)  dexamethasone (DECADRON) injection 10 mg (10 mg Intravenous Given 09/14/21 2038)  ondansetron (ZOFRAN) injection 4 mg (4 mg Intravenous Given 09/14/21 2038)    ED Course/ Medical Decision Making/ A&P                           Medical Decision Making Risk Prescription drug management.   46 year old male with acute right lower back strain with muscle spasm and right lumbar  radiculopathy.  No weakness or neurological deficits.  Symptoms 50 to 75% better after IV pain medication, muscle relaxer and steroid.  Patient able to ambulate 200 feet down the hall.  He is feeling much better and feels comfortable going home.  He still has some tightness in his back but no longer having radicular symptoms down the leg.  Will send home with Norco, Flexeril and a 6-day steroid taper.  He understands signs symptoms return to the ER for such as any increasing pain weakness, loss of bowel or bladder symptoms or urgent changes in his health.  He will follow with orthopedics if no improvement 1 week.  Patient's vital signs are stable and he is ready for discharge to home   Final Clinical Impression(s) / ED Diagnoses Final diagnoses:  Strain of lumbar region, initial encounter  Sciatica of right side  Muscle spasm of back    Rx / DC Orders ED Discharge Orders          Ordered    HYDROcodone-acetaminophen (NORCO) 5-325 MG tablet  Every 4 hours PRN        09/14/21 2111    predniSONE (DELTASONE) 10 MG tablet  Daily        09/14/21 2111    cyclobenzaprine (FLEXERIL) 5 MG tablet  3 times daily PRN        09/14/21 2111              Ronnette Juniper 09/14/21 2113    Chesley Noon, MD 09/14/21 2351

## 2021-09-15 ENCOUNTER — Telehealth: Payer: Self-pay | Admitting: General Practice

## 2021-09-15 MED ORDER — HYDROCODONE-ACETAMINOPHEN 5-325 MG PO TABS: 1.0000 | ORAL_TABLET | ORAL | 0 refills | Status: DC | PRN

## 2021-09-15 NOTE — Telephone Encounter (Signed)
Patient's Norco prescription cannot be filled by [facility] as they are out of the medication.  I have switched the prescription to [facility].  Patient will be notified by the nurse.

## 2022-11-16 ENCOUNTER — Encounter: Payer: 59 | Admitting: Internal Medicine

## 2022-11-21 ENCOUNTER — Other Ambulatory Visit: Payer: Self-pay | Admitting: Internal Medicine

## 2023-03-01 ENCOUNTER — Other Ambulatory Visit: Payer: Self-pay | Admitting: Nurse Practitioner

## 2023-04-01 ENCOUNTER — Other Ambulatory Visit: Payer: Self-pay | Admitting: Internal Medicine

## 2023-05-08 ENCOUNTER — Other Ambulatory Visit: Payer: Self-pay | Admitting: Internal Medicine

## 2023-06-13 ENCOUNTER — Ambulatory Visit: Payer: 59 | Admitting: Internal Medicine

## 2023-06-13 ENCOUNTER — Encounter: Payer: Self-pay | Admitting: Internal Medicine

## 2023-06-13 VITALS — BP 128/80 | HR 75 | Ht 73.0 in | Wt 273.4 lb

## 2023-06-13 DIAGNOSIS — I10 Essential (primary) hypertension: Secondary | ICD-10-CM | POA: Insufficient documentation

## 2023-06-13 DIAGNOSIS — N400 Enlarged prostate without lower urinary tract symptoms: Secondary | ICD-10-CM

## 2023-06-13 DIAGNOSIS — Z0001 Encounter for general adult medical examination with abnormal findings: Secondary | ICD-10-CM

## 2023-06-13 MED ORDER — LISINOPRIL-HYDROCHLOROTHIAZIDE 20-12.5 MG PO TABS: 1.0000 | ORAL_TABLET | Freq: Every day | ORAL | 0 refills | Status: DC

## 2023-06-13 NOTE — Progress Notes (Addendum)
Established Patient Office Visit  Subjective:  Patient ID: Nicholas Solis, male    DOB: October 02, 1975  Age: 47 y.o. MRN: [id number]  No chief complaint on file.   No new complaints, here for BP follow up and to schedule screening colonoscopy.   No other concerns at this time.   No past medical history on file.  No past surgical history on file.  Social History   Socioeconomic History   Marital status: Married    Spouse name: Not on file   Number of children: Not on file   Years of education: Not on file   Highest education level: Not on file  Occupational History   Not on file  Tobacco Use   Smoking status: Every Day   Smokeless tobacco: Never  Substance and Sexual Activity   Alcohol use: Not on file   Drug use: Not on file   Sexual activity: Not on file  Other Topics Concern   Not on file  Social History Narrative   Not on file   Social Determinants of Health   Financial Resource Strain: Not on file  Food Insecurity: Not on file  Transportation Needs: Not on file  Physical Activity: Not on file  Stress: Not on file  Social Connections: Unknown (12/21/2021)   Received from [facility]   Social Network    Social Network: Not on file  Intimate Partner Violence: Unknown (11/12/2021)   Received from [facility]   HITS    Physically Hurt: Not on file    Insult or Talk Down To: Not on file    Threaten Physical Harm: Not on file    Scream or Curse: Not on file    No family history on file.  No Known Allergies  Review of Systems  Constitutional: Negative.   HENT: Negative.    Eyes: Negative.   Respiratory: Negative.    Cardiovascular: Negative.   Gastrointestinal: Negative.   Genitourinary: Negative.   Skin: Negative.   Neurological: Negative.   Endo/Heme/Allergies: Negative.        Objective:   BP 128/80   Pulse 75   Ht 6\' 1"  (1.854 m)   Wt 273 lb 6.4 oz (124 kg)   SpO2 98%   BMI 36.07 kg/m   Vitals:   06/13/23 1033  BP: 128/80   Pulse: 75  Height: 6\' 1"  (1.854 m)  Weight: 273 lb 6.4 oz (124 kg)  SpO2: 98%  BMI (Calculated): 36.08    Physical Exam Vitals reviewed.  Constitutional:      Appearance: Normal appearance. He is obese.  HENT:     Head: Normocephalic.     Left Ear: There is no impacted cerumen.     Nose: Nose normal.     Mouth/Throat:     Mouth: Mucous membranes are moist.     Pharynx: No posterior oropharyngeal erythema.  Eyes:     Extraocular Movements: Extraocular movements intact.     Pupils: Pupils are equal, round, and reactive to light.  Cardiovascular:     Rate and Rhythm: Regular rhythm.     Chest Wall: PMI is not displaced.     Pulses: Normal pulses.     Heart sounds: Normal heart sounds. No murmur heard. Pulmonary:     Effort: Pulmonary effort is normal.     Breath sounds: Normal air entry. No rhonchi or rales.  Abdominal:     General: Abdomen is flat. Bowel sounds are normal. There is no distension.  Palpations: Abdomen is soft. There is no hepatomegaly, splenomegaly or mass.     Tenderness: There is no abdominal tenderness.  Musculoskeletal:        General: Normal range of motion.     Cervical back: Normal range of motion and neck supple.     Right lower leg: No edema.     Left lower leg: No edema.  Skin:    General: Skin is warm and dry.  Neurological:     General: No focal deficit present.     Mental Status: He is alert and oriented to person, place, and time.     Cranial Nerves: No cranial nerve deficit.     Motor: No weakness.  Psychiatric:        Mood and Affect: Mood normal.        Behavior: Behavior normal.      No results found for any visits on 06/13/23.  No results found for this or any previous visit (from the past 2160 hour(Nicholas Solis)).    Assessment & Plan:  As per problem list. Problem List Items Addressed This Visit       Cardiovascular and Mediastinum   Primary hypertension - Primary   Relevant Medications   lisinopril-hydrochlorothiazide  (ZESTORETIC) 20-12.5 MG tablet   Other Relevant Orders   Ambulatory referral to Gastroenterology   Comprehensive metabolic panel   Lipid panel   CBC With Diff/Platelet   Other Visit Diagnoses     Benign prostatic hyperplasia without lower urinary tract symptoms       Relevant Orders   PSA   Annual visit for general adult medical examination with abnormal findings           Return in about 4 weeks (around 07/11/2023) for cpe with labs prior.   Total time spent: 20 minutes  Nicholas Fuse, MD  06/13/2023   This document may have been prepared by [facility] Voice Recognition software and as such may include unintentional dictation errors.

## 2023-06-20 DIAGNOSIS — N400 Enlarged prostate without lower urinary tract symptoms: Secondary | ICD-10-CM | POA: Insufficient documentation

## 2023-06-20 DIAGNOSIS — Z0001 Encounter for general adult medical examination with abnormal findings: Secondary | ICD-10-CM | POA: Insufficient documentation

## 2023-06-22 ENCOUNTER — Other Ambulatory Visit: Payer: 59

## 2023-06-23 LAB — CBC WITH DIFF/PLATELET
Basophils Absolute: 0 10*3/uL (ref 0.0–0.2)
Basos: 0 %
EOS (ABSOLUTE): 0.1 10*3/uL (ref 0.0–0.4)
Eos: 2 %
Hematocrit: 43.9 % (ref 37.5–51.0)
Hemoglobin: 13.9 g/dL (ref 13.0–17.7)
Immature Grans (Abs): 0 10*3/uL (ref 0.0–0.1)
Immature Granulocytes: 0 %
Lymphocytes Absolute: 2 10*3/uL (ref 0.7–3.1)
Lymphs: 34 %
MCH: 25.7 pg — ABNORMAL LOW (ref 26.6–33.0)
MCHC: 31.7 g/dL (ref 31.5–35.7)
MCV: 81 fL (ref 79–97)
Monocytes Absolute: 0.6 10*3/uL (ref 0.1–0.9)
Monocytes: 10 %
Neutrophils Absolute: 3.3 10*3/uL (ref 1.4–7.0)
Neutrophils: 54 %
Platelets: 221 10*3/uL (ref 150–450)
RBC: 5.41 x10E6/uL (ref 4.14–5.80)
RDW: 12.9 % (ref 11.6–15.4)
WBC: 6.1 10*3/uL (ref 3.4–10.8)

## 2023-06-23 LAB — COMPREHENSIVE METABOLIC PANEL
ALT: 29 [IU]/L (ref 0–44)
AST: 27 [IU]/L (ref 0–40)
Albumin: 4.5 g/dL (ref 4.1–5.1)
Alkaline Phosphatase: 46 [IU]/L (ref 44–121)
BUN/Creatinine Ratio: 20 (ref 9–20)
BUN: 21 mg/dL (ref 6–24)
Bilirubin Total: 0.4 mg/dL (ref 0.0–1.2)
CO2: 19 mmol/L — ABNORMAL LOW (ref 20–29)
Calcium: 9.1 mg/dL (ref 8.7–10.2)
Chloride: 102 mmol/L (ref 96–106)
Creatinine, Ser: 1.07 mg/dL (ref 0.76–1.27)
Globulin, Total: 2.6 g/dL (ref 1.5–4.5)
Glucose: 96 mg/dL (ref 70–99)
Potassium: 4.4 mmol/L (ref 3.5–5.2)
Sodium: 138 mmol/L (ref 134–144)
Total Protein: 7.1 g/dL (ref 6.0–8.5)
eGFR: 85 mL/min/{1.73_m2} (ref >59)

## 2023-06-23 LAB — LIPID PANEL
Chol/HDL Ratio: 3.7 ratio (ref 0.0–5.0)
Cholesterol, Total: 174 mg/dL (ref 100–199)
HDL: 47 mg/dL (ref >39)
LDL Chol Calc (NIH): 91 mg/dL (ref 0–99)
Triglycerides: 215 mg/dL — ABNORMAL HIGH (ref 0–149)
VLDL Cholesterol Cal: 36 mg/dL (ref 5–40)

## 2023-06-23 LAB — PSA: Prostate Specific Ag, Serum: 0.6 ng/mL (ref 0.0–4.0)

## 2023-07-05 ENCOUNTER — Other Ambulatory Visit: Payer: Self-pay

## 2023-07-05 DIAGNOSIS — I10 Essential (primary) hypertension: Secondary | ICD-10-CM

## 2023-07-05 MED ORDER — LISINOPRIL-HYDROCHLOROTHIAZIDE 20-12.5 MG PO TABS: 1.0000 | ORAL_TABLET | Freq: Every day | ORAL | 0 refills | Status: DC

## 2023-07-11 ENCOUNTER — Ambulatory Visit (INDEPENDENT_AMBULATORY_CARE_PROVIDER_SITE_OTHER): Payer: 59 | Admitting: Internal Medicine

## 2023-07-11 ENCOUNTER — Other Ambulatory Visit: Payer: Self-pay

## 2023-07-11 ENCOUNTER — Encounter: Payer: Self-pay | Admitting: Internal Medicine

## 2023-07-11 VITALS — BP 142/90 | HR 76 | Ht 74.0 in | Wt 223.0 lb

## 2023-07-11 DIAGNOSIS — I10 Essential (primary) hypertension: Secondary | ICD-10-CM | POA: Diagnosis not present

## 2023-07-11 DIAGNOSIS — E782 Mixed hyperlipidemia: Secondary | ICD-10-CM | POA: Insufficient documentation

## 2023-07-11 DIAGNOSIS — Z0001 Encounter for general adult medical examination with abnormal findings: Secondary | ICD-10-CM

## 2023-07-11 DIAGNOSIS — Z1331 Encounter for screening for depression: Secondary | ICD-10-CM

## 2023-07-11 NOTE — Progress Notes (Signed)
Established Patient Office Visit  Subjective:  Patient ID: Nicholas Solis, male    DOB: 07-06-1976  Age: 47 y.o. MRN: [id number]  No chief complaint on file.   No new complaints, here for CPE, lab review and medication refills. Labs reviewed and notable for well controlled lipids except elevated triglycerides. PSA and CMP unremarkable.    No other concerns at this time.   History reviewed. No pertinent past medical history.  History reviewed. No pertinent surgical history.  Social History   Socioeconomic History   Marital status: Married    Spouse name: Not on file   Number of children: 4   Years of education: Not on file   Highest education level: High school graduate  Occupational History   Occupation: [employer] doctor  Tobacco Use   Smoking status: Some Days    Types: Cigarettes   Smokeless tobacco: Never  Vaping Use   Vaping status: Never Used  Substance and Sexual Activity   Alcohol use: Yes    Alcohol/week: 6.0 standard drinks of alcohol    Types: 6 Cans of beer per week   Drug use: Never   Sexual activity: Yes  Other Topics Concern   Not on file  Social History Narrative   Not on file   Social Determinants of Health   Financial Resource Strain: Not on file  Food Insecurity: Not on file  Transportation Needs: Not on file  Physical Activity: Not on file  Stress: Not on file  Social Connections: Unknown (12/21/2021)   [facility]   Social Network    Social Network: Not on file  Intimate Partner Violence: Unknown (11/12/2021)   Received from [facility]   HITS    Physically Hurt: Not on file    Insult or Talk Down To: Not on file    Threaten Physical Harm: Not on file    Scream or Curse: Not on file    History reviewed. No pertinent family history.  No Known Allergies  Outpatient Medications Prior to Visit  Medication Sig   cetirizine (ZYRTEC) 10 MG tablet Take 10 mg by mouth daily.   cyclobenzaprine (FLEXERIL) 5 MG tablet  Take 1-2 tablets (5-10 mg total) by mouth 3 (three) times daily as needed for muscle spasms.   fluticasone (FLONASE) 50 MCG/ACT nasal spray Place 1 spray into both nostrils daily.   HYDROcodone-acetaminophen (NORCO) 5-325 MG tablet Take 1 tablet by mouth every 4 (four) hours as needed for moderate pain.   HYDROcodone-acetaminophen (NORCO/VICODIN) 5-325 MG tablet Take 1 tablet by mouth every 4 (four) hours as needed.   ibuprofen (ADVIL,MOTRIN) 600 MG tablet Take 1 tablet (600 mg total) by mouth every 6 (six) hours as needed.   lisinopril-hydrochlorothiazide (ZESTORETIC) 20-12.5 MG tablet Take 1 tablet by mouth daily.   predniSONE (DELTASONE) 10 MG tablet Take 1 tablet (10 mg total) by mouth daily. 6,5,4,3,2,1 six day taper   No facility-administered medications prior to visit.    Review of Systems  Constitutional: Negative.  Negative for weight loss.  HENT: Negative.    Eyes: Negative.   Respiratory: Negative.    Cardiovascular: Negative.   Gastrointestinal: Negative.   Genitourinary: Negative.   Musculoskeletal:  Positive for joint pain (bilat knees).  Skin: Negative.   Neurological: Negative.   Endo/Heme/Allergies: Negative.        Objective:   BP (!) 142/90   Pulse 76   Ht 6\' 2"  (1.88 m)   Wt 223 lb (101.2 kg)  SpO2 98%   BMI 28.63 kg/m   Vitals:   07/11/23 1027  BP: (!) 142/90  Pulse: 76  Height: 6\' 2"  (1.88 m)  Weight: 223 lb (101.2 kg)  SpO2: 98%  BMI (Calculated): 28.62    Physical Exam Vitals reviewed.  Constitutional:      Appearance: Normal appearance. He is obese.  HENT:     Head: Normocephalic.     Left Ear: There is no impacted cerumen.     Nose: Nose normal.     Mouth/Throat:     Mouth: Mucous membranes are moist.     Pharynx: No posterior oropharyngeal erythema.  Eyes:     Extraocular Movements: Extraocular movements intact.     Pupils: Pupils are equal, round, and reactive to light.  Cardiovascular:     Rate and Rhythm: Regular rhythm.      Chest Wall: PMI is not displaced.     Pulses: Normal pulses.     Heart sounds: Normal heart sounds. No murmur heard. Pulmonary:     Effort: Pulmonary effort is normal.     Breath sounds: Normal air entry. No rhonchi or rales.  Abdominal:     General: Abdomen is flat. Bowel sounds are normal. There is no distension.     Palpations: Abdomen is soft. There is no hepatomegaly, splenomegaly or mass.     Tenderness: There is no abdominal tenderness.  Musculoskeletal:        General: Normal range of motion.     Cervical back: Normal range of motion and neck supple.     Right lower leg: No edema.     Left lower leg: No edema.  Skin:    General: Skin is warm and dry.  Neurological:     General: No focal deficit present.     Mental Status: He is alert and oriented to person, place, and time.     Cranial Nerves: No cranial nerve deficit.     Motor: No weakness.  Psychiatric:        Mood and Affect: Mood normal.        Behavior: Behavior normal.      No results found for any visits on 07/11/23.  Recent Results (from the past 2160 hour(Desiree Fleming))  Comprehensive metabolic panel     Status: Abnormal   Collection Time: 06/22/23  8:44 AM  Result Value Ref Range   Glucose 96 70 - 99 mg/dL   BUN 21 6 - 24 mg/dL   Creatinine, Ser 4.03 0.76 - 1.27 mg/dL   eGFR 85 >47 QQ/VZD/6.38   BUN/Creatinine Ratio 20 9 - 20   Sodium 138 134 - 144 mmol/L   Potassium 4.4 3.5 - 5.2 mmol/L   Chloride 102 96 - 106 mmol/L   CO2 19 (L) 20 - 29 mmol/L   Calcium 9.1 8.7 - 10.2 mg/dL   Total Protein 7.1 6.0 - 8.5 g/dL   Albumin 4.5 4.1 - 5.1 g/dL   Globulin, Total 2.6 1.5 - 4.5 g/dL   Bilirubin Total 0.4 0.0 - 1.2 mg/dL   Alkaline Phosphatase 46 44 - 121 IU/L   AST 27 0 - 40 IU/L   ALT 29 0 - 44 IU/L  Lipid panel     Status: Abnormal   Collection Time: 06/22/23  8:44 AM  Result Value Ref Range   Cholesterol, Total 174 100 - 199 mg/dL   Triglycerides 756 (H) 0 - 149 mg/dL   HDL 47 >43 mg/dL   VLDL Cholesterol  Cal  36 5 - 40 mg/dL   LDL Chol Calc (NIH) 91 0 - 99 mg/dL   Chol/HDL Ratio 3.7 0.0 - 5.0 ratio    Comment:                                   T. Chol/HDL Ratio                                             Men  Women                               1/2 Avg.Risk  3.4    3.3                                   Avg.Risk  5.0    4.4                                2X Avg.Risk  9.6    7.1                                3X Avg.Risk 23.4   11.0   CBC With Diff/Platelet     Status: Abnormal   Collection Time: 06/22/23  8:44 AM  Result Value Ref Range   WBC 6.1 3.4 - 10.8 x10E3/uL   RBC 5.41 4.14 - 5.80 x10E6/uL   Hemoglobin 13.9 13.0 - 17.7 g/dL   Hematocrit 14.7 82.9 - 51.0 %   MCV 81 79 - 97 fL   MCH 25.7 (L) 26.6 - 33.0 pg   MCHC 31.7 31.5 - 35.7 g/dL   RDW 56.2 13.0 - 86.5 %   Platelets 221 150 - 450 x10E3/uL   Neutrophils 54 Not Estab. %   Lymphs 34 Not Estab. %   Monocytes 10 Not Estab. %   Eos 2 Not Estab. %   Basos 0 Not Estab. %   Neutrophils Absolute 3.3 1.4 - 7.0 x10E3/uL   Lymphocytes Absolute 2.0 0.7 - 3.1 x10E3/uL   Monocytes Absolute 0.6 0.1 - 0.9 x10E3/uL   EOS (ABSOLUTE) 0.1 0.0 - 0.4 x10E3/uL   Basophils Absolute 0.0 0.0 - 0.2 x10E3/uL   Immature Granulocytes 0 Not Estab. %   Immature Grans (Abs) 0.0 0.0 - 0.1 x10E3/uL  PSA     Status: None   Collection Time: 06/22/23  8:44 AM  Result Value Ref Range   Prostate Specific Ag, Serum 0.6 0.0 - 4.0 ng/mL    Comment: Roche ECLIA methodology. According to the American Urological Association, Serum PSA should decrease and remain at undetectable levels after radical prostatectomy. The AUA defines biochemical recurrence as an initial PSA value 0.2 ng/mL or greater followed by a subsequent confirmatory PSA value 0.2 ng/mL or greater. Values obtained with different assay methods or kits cannot be used interchangeably. Results cannot be interpreted as absolute evidence of the presence or absence of malignant disease.        Assessment & Plan:  As per problem list. Stricter low calorie diet, low cholesterol and low fat diet and exercise as  much as possible.  Problem List Items Addressed This Visit       Cardiovascular and Mediastinum   Primary hypertension - Primary     Other   Annual visit for general adult medical examination with abnormal findings   Mixed hyperlipidemia    Return in about 3 weeks (around 08/01/2023) for BP followup.   Total time spent: 30 minutes  Luna Fuse, MD  07/11/2023   This document may have been prepared by [facility] Voice Recognition software and as such may include unintentional dictation errors.

## 2023-08-05 ENCOUNTER — Ambulatory Visit: Payer: 59 | Admitting: Internal Medicine

## 2023-08-19 ENCOUNTER — Other Ambulatory Visit: Payer: Self-pay

## 2023-08-19 DIAGNOSIS — I10 Essential (primary) hypertension: Secondary | ICD-10-CM

## 2023-08-20 ENCOUNTER — Other Ambulatory Visit: Payer: Self-pay | Admitting: Internal Medicine

## 2023-08-20 DIAGNOSIS — I10 Essential (primary) hypertension: Secondary | ICD-10-CM

## 2023-08-22 ENCOUNTER — Telehealth: Payer: Self-pay | Admitting: Internal Medicine

## 2023-08-22 ENCOUNTER — Other Ambulatory Visit: Payer: Self-pay

## 2023-08-22 DIAGNOSIS — I10 Essential (primary) hypertension: Secondary | ICD-10-CM

## 2023-08-22 NOTE — Telephone Encounter (Signed)
 Request sent to PCP for refill.

## 2023-08-22 NOTE — Telephone Encounter (Signed)
 Pt needs a refill on his lisinopril, sent to [facility] in [place]

## 2023-08-23 ENCOUNTER — Telehealth: Payer: Self-pay | Admitting: Internal Medicine

## 2023-08-23 ENCOUNTER — Other Ambulatory Visit: Payer: Self-pay | Admitting: Internal Medicine

## 2023-08-23 DIAGNOSIS — I10 Essential (primary) hypertension: Secondary | ICD-10-CM

## 2023-08-23 MED ORDER — LISINOPRIL-HYDROCHLOROTHIAZIDE 20-12.5 MG PO TABS: 1.0000 | ORAL_TABLET | Freq: Every day | ORAL | 0 refills | Status: DC

## 2023-08-23 NOTE — Telephone Encounter (Signed)
 Entered in error

## 2023-08-23 NOTE — Telephone Encounter (Signed)
 Pt is out of town in [place]  for work, states that he forgot about his last appt then had to go out of town. Pt has been out of his LISINOPRIL  for a week.  Pt is asking if you could refill his LISINOPRIL  and his wife could pick it up, as she is leaving to go to [place]  to meet him this Friday. Pt states that he will be back in town at the end of this month & could come in for an appt. Told pt I would call him and let him know what you say  Thank you

## 2023-12-07 ENCOUNTER — Ambulatory Visit: Payer: Self-pay | Admitting: Internal Medicine

## 2023-12-07 ENCOUNTER — Other Ambulatory Visit: Payer: Self-pay

## 2023-12-07 ENCOUNTER — Other Ambulatory Visit

## 2023-12-07 DIAGNOSIS — N400 Enlarged prostate without lower urinary tract symptoms: Secondary | ICD-10-CM

## 2023-12-07 DIAGNOSIS — I10 Essential (primary) hypertension: Secondary | ICD-10-CM

## 2023-12-07 MED ORDER — LISINOPRIL-HYDROCHLOROTHIAZIDE 20-12.5 MG PO TABS: 1.0000 | ORAL_TABLET | Freq: Every day | ORAL | 0 refills | Status: DC

## 2023-12-08 LAB — LIPID PANEL
Chol/HDL Ratio: 3.2 ratio (ref 0.0–5.0)
Cholesterol, Total: 187 mg/dL (ref 100–199)
HDL: 59 mg/dL (ref >39)
LDL Chol Calc (NIH): 109 mg/dL — ABNORMAL HIGH (ref 0–99)
Triglycerides: 104 mg/dL (ref 0–149)
VLDL Cholesterol Cal: 19 mg/dL (ref 5–40)

## 2023-12-08 LAB — CBC WITH DIFF/PLATELET
Basophils Absolute: 0 10*3/uL (ref 0.0–0.2)
Basos: 1 %
EOS (ABSOLUTE): 0.1 10*3/uL (ref 0.0–0.4)
Eos: 2 %
Hematocrit: 43.4 % (ref 37.5–51.0)
Hemoglobin: 13.9 g/dL (ref 13.0–17.7)
Immature Grans (Abs): 0 10*3/uL (ref 0.0–0.1)
Immature Granulocytes: 0 %
Lymphocytes Absolute: 1.8 10*3/uL (ref 0.7–3.1)
Lymphs: 35 %
MCH: 25.3 pg — ABNORMAL LOW (ref 26.6–33.0)
MCHC: 32 g/dL (ref 31.5–35.7)
MCV: 79 fL (ref 79–97)
Monocytes Absolute: 0.5 10*3/uL (ref 0.1–0.9)
Monocytes: 10 %
Neutrophils Absolute: 2.7 10*3/uL (ref 1.4–7.0)
Neutrophils: 52 %
Platelets: 208 10*3/uL (ref 150–450)
RBC: 5.5 x10E6/uL (ref 4.14–5.80)
RDW: 12.8 % (ref 11.6–15.4)
WBC: 5.2 10*3/uL (ref 3.4–10.8)

## 2023-12-08 LAB — COMPREHENSIVE METABOLIC PANEL WITH GFR
ALT: 27 IU/L (ref 0–44)
AST: 32 IU/L (ref 0–40)
Albumin: 4.8 g/dL (ref 4.1–5.1)
Alkaline Phosphatase: 54 IU/L (ref 44–121)
BUN/Creatinine Ratio: 15 (ref 9–20)
BUN: 16 mg/dL (ref 6–24)
Bilirubin Total: 0.7 mg/dL (ref 0.0–1.2)
CO2: 23 mmol/L (ref 20–29)
Calcium: 9.5 mg/dL (ref 8.7–10.2)
Chloride: 101 mmol/L (ref 96–106)
Creatinine, Ser: 1.08 mg/dL (ref 0.76–1.27)
Globulin, Total: 2.5 g/dL (ref 1.5–4.5)
Glucose: 97 mg/dL (ref 70–99)
Potassium: 4.4 mmol/L (ref 3.5–5.2)
Sodium: 138 mmol/L (ref 134–144)
Total Protein: 7.3 g/dL (ref 6.0–8.5)
eGFR: 84 mL/min/{1.73_m2} (ref >59)

## 2023-12-08 LAB — PSA: Prostate Specific Ag, Serum: 0.7 ng/mL (ref 0.0–4.0)

## 2023-12-20 ENCOUNTER — Ambulatory Visit: Payer: Self-pay | Admitting: Internal Medicine

## 2023-12-20 ENCOUNTER — Encounter: Payer: Self-pay | Admitting: Internal Medicine

## 2023-12-20 VITALS — BP 140/82 | HR 80 | Ht 74.0 in | Wt 269.0 lb

## 2023-12-20 DIAGNOSIS — I10 Essential (primary) hypertension: Secondary | ICD-10-CM

## 2023-12-20 DIAGNOSIS — E782 Mixed hyperlipidemia: Secondary | ICD-10-CM

## 2023-12-20 MED ORDER — LISINOPRIL-HYDROCHLOROTHIAZIDE 20-12.5 MG PO TABS: 1.0000 | ORAL_TABLET | Freq: Every day | ORAL | 0 refills | Status: DC

## 2023-12-20 NOTE — Progress Notes (Signed)
 Established Patient Office Visit  Subjective:  Patient ID: Nicholas Solis, male    DOB: 1976/03/22  Age: 48 y.o. MRN: [id number]  Chief Complaint  Patient presents with   Follow-up    Follow up rx refills    No new complaints, here for lab review and medication refills.Labs reviewed and notable for deterioration in chol although triglycerides have improved.    No other concerns at this time.   No past medical history on file.  No past surgical history on file.  Social History   Socioeconomic History   Marital status: Married    Spouse name: Not on file   Number of children: 4   Years of education: Not on file   Highest education level: High school graduate  Occupational History   Occupation: [employer] doctor  Tobacco Use   Smoking status: Some Days    Types: Cigarettes   Smokeless tobacco: Never  Vaping Use   Vaping status: Never Used  Substance and Sexual Activity   Alcohol use: Yes    Alcohol/week: 6.0 standard drinks of alcohol    Types: 6 Cans of beer per week   Drug use: Never   Sexual activity: Yes  Other Topics Concern   Not on file  Social History Narrative   Not on file   Social Drivers of Health   Financial Resource Strain: Not on file  Food Insecurity: Not on file  Transportation Needs: Not on file  Physical Activity: Not on file  Stress: Not on file  Social Connections: Unknown (12/21/2021)   Received from [facility]   Social Network    Social Network: Not on file  Intimate Partner Violence: Unknown (11/12/2021)   Received from [facility]   HITS    Physically Hurt: Not on file    Insult or Talk Down To: Not on file    Threaten Physical Harm: Not on file    Scream or Curse: Not on file    No family history on file.  No Known Allergies  Outpatient Medications Prior to Visit  Medication Sig   [DISCONTINUED] lisinopril -hydrochlorothiazide  (ZESTORETIC ) 20-12.5 MG tablet Take 1 tablet by mouth daily.   cetirizine (ZYRTEC) 10 MG  tablet Take 10 mg by mouth daily. (Patient not taking: Reported on 12/20/2023)   cyclobenzaprine  (FLEXERIL ) 5 MG tablet Take 1-2 tablets (5-10 mg total) by mouth 3 (three) times daily as needed for muscle spasms. (Patient not taking: Reported on 12/20/2023)   fluticasone (FLONASE) 50 MCG/ACT nasal spray Place 1 spray into both nostrils daily. (Patient not taking: Reported on 12/20/2023)   HYDROcodone -acetaminophen  (NORCO) 5-325 MG tablet Take 1 tablet by mouth every 4 (four) hours as needed for moderate pain. (Patient not taking: Reported on 12/20/2023)   HYDROcodone -acetaminophen  (NORCO/VICODIN) 5-325 MG tablet Take 1 tablet by mouth every 4 (four) hours as needed. (Patient not taking: Reported on 12/20/2023)   ibuprofen  (ADVIL ,MOTRIN ) 600 MG tablet Take 1 tablet (600 mg total) by mouth every 6 (six) hours as needed. (Patient not taking: Reported on 12/20/2023)   predniSONE  (DELTASONE ) 10 MG tablet Take 1 tablet (10 mg total) by mouth daily. 6,5,4,3,2,1 six day taper (Patient not taking: Reported on 12/20/2023)   No facility-administered medications prior to visit.    Review of Systems  Constitutional: Negative.  Negative for weight loss.  HENT: Negative.    Eyes: Negative.   Respiratory: Negative.    Cardiovascular: Negative.   Gastrointestinal: Negative.   Genitourinary: Negative.   Musculoskeletal:  Positive for  joint pain (bilat knees).  Skin: Negative.   Neurological: Negative.   Endo/Heme/Allergies: Negative.        Objective:   BP (!) 140/82   Pulse 80   Ht 6\' 2"  (1.88 m)   Wt 269 lb (122 kg)   SpO2 97%   BMI 34.54 kg/m   Vitals:   12/20/23 1540  BP: (!) 140/82  Pulse: 80  Height: 6\' 2"  (1.88 m)  Weight: 269 lb (122 kg)  SpO2: 97%  BMI (Calculated): 34.52    Physical Exam Vitals reviewed.  Constitutional:      Appearance: Normal appearance. He is obese.  HENT:     Head: Normocephalic.     Left Ear: There is no impacted cerumen.     Nose: Nose normal.      Mouth/Throat:     Mouth: Mucous membranes are moist.     Pharynx: No posterior oropharyngeal erythema.  Eyes:     Extraocular Movements: Extraocular movements intact.     Pupils: Pupils are equal, round, and reactive to light.  Cardiovascular:     Rate and Rhythm: Regular rhythm.     Chest Wall: PMI is not displaced.     Pulses: Normal pulses.     Heart sounds: Normal heart sounds. No murmur heard. Pulmonary:     Effort: Pulmonary effort is normal.     Breath sounds: Normal air entry. No rhonchi or rales.  Abdominal:     General: Abdomen is flat. Bowel sounds are normal. There is no distension.     Palpations: Abdomen is soft. There is no hepatomegaly, splenomegaly or mass.     Tenderness: There is no abdominal tenderness.  Musculoskeletal:        General: Normal range of motion.     Cervical back: Normal range of motion and neck supple.     Right lower leg: No edema.     Left lower leg: No edema.  Skin:    General: Skin is warm and dry.  Neurological:     General: No focal deficit present.     Mental Status: He is alert and oriented to person, place, and time.     Cranial Nerves: No cranial nerve deficit.     Motor: No weakness.  Psychiatric:        Mood and Affect: Mood normal.        Behavior: Behavior normal.      No results found for any visits on 12/20/23.  Recent Results (from the past 2160 hours)  Comprehensive metabolic panel     Status: None   Collection Time: 12/07/23  8:18 AM  Result Value Ref Range   Glucose 97 70 - 99 mg/dL   BUN 16 6 - 24 mg/dL   Creatinine, Ser 4.54 0.76 - 1.27 mg/dL   eGFR 84 >09 WJ/XBJ/4.78   BUN/Creatinine Ratio 15 9 - 20   Sodium 138 134 - 144 mmol/L   Potassium 4.4 3.5 - 5.2 mmol/L   Chloride 101 96 - 106 mmol/L   CO2 23 20 - 29 mmol/L   Calcium 9.5 8.7 - 10.2 mg/dL   Total Protein 7.3 6.0 - 8.5 g/dL   Albumin 4.8 4.1 - 5.1 g/dL   Globulin, Total 2.5 1.5 - 4.5 g/dL   Bilirubin Total 0.7 0.0 - 1.2 mg/dL   Alkaline  Phosphatase 54 44 - 121 IU/L   AST 32 0 - 40 IU/L   ALT 27 0 - 44 IU/L  Lipid panel  Status: Abnormal   Collection Time: 12/07/23  8:18 AM  Result Value Ref Range   Cholesterol, Total 187 100 - 199 mg/dL   Triglycerides 161 0 - 149 mg/dL   HDL 59 >09 mg/dL   VLDL Cholesterol Cal 19 5 - 40 mg/dL   LDL Chol Calc (NIH) 604 (H) 0 - 99 mg/dL   Chol/HDL Ratio 3.2 0.0 - 5.0 ratio    Comment:                                   T. Chol/HDL Ratio                                             Men  Women                               1/2 Avg.Risk  3.4    3.3                                   Avg.Risk  5.0    4.4                                2X Avg.Risk  9.6    7.1                                3X Avg.Risk 23.4   11.0   CBC With Diff/Platelet     Status: Abnormal   Collection Time: 12/07/23  8:18 AM  Result Value Ref Range   WBC 5.2 3.4 - 10.8 x10E3/uL   RBC 5.50 4.14 - 5.80 x10E6/uL   Hemoglobin 13.9 13.0 - 17.7 g/dL   Hematocrit 54.0 98.1 - 51.0 %   MCV 79 79 - 97 fL   MCH 25.3 (L) 26.6 - 33.0 pg   MCHC 32.0 31.5 - 35.7 g/dL   RDW 19.1 47.8 - 29.5 %   Platelets 208 150 - 450 x10E3/uL   Neutrophils 52 Not Estab. %   Lymphs 35 Not Estab. %   Monocytes 10 Not Estab. %   Eos 2 Not Estab. %   Basos 1 Not Estab. %   Neutrophils Absolute 2.7 1.4 - 7.0 x10E3/uL   Lymphocytes Absolute 1.8 0.7 - 3.1 x10E3/uL   Monocytes Absolute 0.5 0.1 - 0.9 x10E3/uL   EOS (ABSOLUTE) 0.1 0.0 - 0.4 x10E3/uL   Basophils Absolute 0.0 0.0 - 0.2 x10E3/uL   Immature Granulocytes 0 Not Estab. %   Immature Grans (Abs) 0.0 0.0 - 0.1 x10E3/uL  PSA     Status: None   Collection Time: 12/07/23  8:18 AM  Result Value Ref Range   Prostate Specific Ag, Serum 0.7 0.0 - 4.0 ng/mL    Comment: Roche ECLIA methodology. According to the American Urological Association, Serum PSA should decrease and remain at undetectable levels after radical prostatectomy. The AUA defines biochemical recurrence as an initial PSA value  0.2 ng/mL or greater followed by a subsequent confirmatory PSA value 0.2 ng/mL or greater. Values obtained with different assay methods or kits cannot be  used interchangeably. Results cannot be interpreted as absolute evidence of the presence or absence of malignant disease.       Assessment & Plan:  As per problem list. Stricter low calorie diet, low cholesterol and low fat diet and exercise as much as possible.  Problem List Items Addressed This Visit       Cardiovascular and Mediastinum   Primary hypertension   Relevant Medications   lisinopril -hydrochlorothiazide  (ZESTORETIC ) 20-12.5 MG tablet    Return in about 3 months (around 03/21/2024) for BP followup, fu with labs prior.   Total time spent: 20 minutes  Arzella Bitters, MD  12/20/2023   This document may have been prepared by [facility] Voice Recognition software and as such may include unintentional dictation errors.

## 2024-03-30 ENCOUNTER — Ambulatory Visit: Admitting: Internal Medicine

## 2024-03-30 VITALS — BP 148/88 | HR 75 | Temp 98.3°F | Ht 74.0 in | Wt 281.2 lb

## 2024-03-30 DIAGNOSIS — E782 Mixed hyperlipidemia: Secondary | ICD-10-CM | POA: Diagnosis not present

## 2024-03-30 DIAGNOSIS — I10 Essential (primary) hypertension: Secondary | ICD-10-CM | POA: Diagnosis not present

## 2024-03-30 MED ORDER — OLMESARTAN MEDOXOMIL-HCTZ 40-12.5 MG PO TABS: 1.0000 | ORAL_TABLET | Freq: Every day | ORAL | 0 refills | Status: DC

## 2024-03-30 NOTE — Progress Notes (Signed)
 Established Patient Office Visit  Subjective:  Patient ID: Nicholas Solis, male    DOB: 08/28/1975  Age: 48 y.o. MRN: [id number]  Chief Complaint  Patient presents with   Follow-up    3 month follow up lab results and check bp     No new complaints, here for lab review and medication refills. Gained 12 lbs and bp elevated today.    No other concerns at this time.   No past medical history on file.  No past surgical history on file.  Social History   Socioeconomic History   Marital status: Married    Spouse name: Not on file   Number of children: 4   Years of education: Not on file   Highest education level: High school graduate  Occupational History   Occupation: [employer] doctor  Tobacco Use   Smoking status: Some Days    Types: Cigarettes   Smokeless tobacco: Never  Vaping Use   Vaping status: Never Used  Substance and Sexual Activity   Alcohol use: Yes    Alcohol/week: 6.0 standard drinks of alcohol    Types: 6 Cans of beer per week   Drug use: Never   Sexual activity: Yes  Other Topics Concern   Not on file  Social History Narrative   Not on file   Social Drivers of Health   Financial Resource Strain: Not on file  Food Insecurity: Not on file  Transportation Needs: Not on file  Physical Activity: Not on file  Stress: Not on file  Social Connections: Unknown (12/21/2021)   Received from [facility]   Social Network    Social Network: Not on file  Intimate Partner Violence: Unknown (11/12/2021)   Received from [facility]   HITS    Physically Hurt: Not on file    Insult or Talk Down To: Not on file    Threaten Physical Harm: Not on file    Scream or Curse: Not on file    No family history on file.  No Known Allergies  Outpatient Medications Prior to Visit  Medication Sig   [DISCONTINUED] lisinopril -hydrochlorothiazide  (ZESTORETIC ) 20-12.5 MG tablet Take 1 tablet by mouth daily.   cetirizine (ZYRTEC) 10 MG tablet Take 10 mg by mouth  daily. (Patient not taking: Reported on 03/30/2024)   cyclobenzaprine  (FLEXERIL ) 5 MG tablet Take 1-2 tablets (5-10 mg total) by mouth 3 (three) times daily as needed for muscle spasms. (Patient not taking: Reported on 03/30/2024)   fluticasone (FLONASE) 50 MCG/ACT nasal spray Place 1 spray into both nostrils daily. (Patient not taking: Reported on 03/30/2024)   HYDROcodone -acetaminophen  (NORCO) 5-325 MG tablet Take 1 tablet by mouth every 4 (four) hours as needed for moderate pain. (Patient not taking: Reported on 03/30/2024)   HYDROcodone -acetaminophen  (NORCO/VICODIN) 5-325 MG tablet Take 1 tablet by mouth every 4 (four) hours as needed. (Patient not taking: Reported on 03/30/2024)   ibuprofen  (ADVIL ,MOTRIN ) 600 MG tablet Take 1 tablet (600 mg total) by mouth every 6 (six) hours as needed. (Patient not taking: Reported on 03/30/2024)   predniSONE  (DELTASONE ) 10 MG tablet Take 1 tablet (10 mg total) by mouth daily. 6,5,4,3,2,1 six day taper (Patient not taking: Reported on 03/30/2024)   No facility-administered medications prior to visit.    Review of Systems  Constitutional: Negative.  Negative for weight loss.  HENT: Negative.    Eyes: Negative.   Respiratory: Negative.    Cardiovascular: Negative.   Gastrointestinal: Negative.   Genitourinary: Negative.   Musculoskeletal:  Positive for joint pain (bilat knees).  Skin: Negative.   Neurological: Negative.   Endo/Heme/Allergies: Negative.        Objective:   BP (!) 148/88 (Cuff Size: Large)   Pulse 75   Temp 98.3 F (36.8 C)   Ht 6' 2 (1.88 m)   Wt 281 lb 3.2 oz (127.6 kg)   SpO2 96%   BMI 36.10 kg/m   Vitals:   03/30/24 1432 03/30/24 1507  BP: (!) 150/94 (!) 148/88  Pulse: 75   Temp: 98.3 F (36.8 C)   Height: 6' 2 (1.88 m)   Weight: 281 lb 3.2 oz (127.6 kg)   SpO2: 96%   BMI (Calculated): 36.09     Physical Exam Vitals reviewed.  Constitutional:      Appearance: Normal appearance. He is obese.  HENT:     Head:  Normocephalic.     Left Ear: There is no impacted cerumen.     Nose: Nose normal.     Mouth/Throat:     Mouth: Mucous membranes are moist.     Pharynx: No posterior oropharyngeal erythema.  Eyes:     Extraocular Movements: Extraocular movements intact.     Pupils: Pupils are equal, round, and reactive to light.  Cardiovascular:     Rate and Rhythm: Regular rhythm.     Chest Wall: PMI is not displaced.     Pulses: Normal pulses.     Heart sounds: Normal heart sounds. No murmur heard. Pulmonary:     Effort: Pulmonary effort is normal.     Breath sounds: Normal air entry. No rhonchi or rales.  Abdominal:     General: Abdomen is flat. Bowel sounds are normal. There is no distension.     Palpations: Abdomen is soft. There is no hepatomegaly, splenomegaly or mass.     Tenderness: There is no abdominal tenderness.  Musculoskeletal:        General: Normal range of motion.     Cervical back: Normal range of motion and neck supple.     Right lower leg: No edema.     Left lower leg: No edema.  Skin:    General: Skin is warm and dry.  Neurological:     General: No focal deficit present.     Mental Status: He is alert and oriented to person, place, and time.     Cranial Nerves: No cranial nerve deficit.     Motor: No weakness.  Psychiatric:        Mood and Affect: Mood normal.        Behavior: Behavior normal.      No results found for any visits on 03/30/24.  No results found for this or any previous visit (from the past 2160 hours).    Assessment & Plan:  Nicholas Solis was seen today for follow-up.  Primary hypertension -     Olmesartan  Medoxomil-HCTZ; Take 1 tablet by mouth daily.  Dispense: 30 tablet; Refill: 0  Mixed hyperlipidemia    Problem List Items Addressed This Visit       Cardiovascular and Mediastinum   Primary hypertension - Primary   Relevant Medications   olmesartan -hydrochlorothiazide  (BENICAR  HCT) 40-12.5 MG tablet     Other   Mixed hyperlipidemia    Relevant Medications   olmesartan -hydrochlorothiazide  (BENICAR  HCT) 40-12.5 MG tablet    Return in about 3 weeks (around 04/20/2024) for BP followup.   Total time spent: 20 minutes  Sherrill Nicholas Perry, MD  03/30/2024   This document may  have been prepared by [employer] and as such may include unintentional dictation errors.

## 2024-04-02 ENCOUNTER — Other Ambulatory Visit: Payer: Self-pay | Admitting: Internal Medicine

## 2024-04-02 DIAGNOSIS — I10 Essential (primary) hypertension: Secondary | ICD-10-CM

## 2024-04-20 ENCOUNTER — Ambulatory Visit: Admitting: Internal Medicine

## 2024-04-23 ENCOUNTER — Other Ambulatory Visit: Payer: Self-pay

## 2024-04-23 DIAGNOSIS — I10 Essential (primary) hypertension: Secondary | ICD-10-CM

## 2024-04-23 MED ORDER — OLMESARTAN MEDOXOMIL-HCTZ 40-12.5 MG PO TABS: 1.0000 | ORAL_TABLET | Freq: Every day | ORAL | 0 refills | Status: DC

## 2024-04-24 ENCOUNTER — Ambulatory Visit: Admitting: Internal Medicine

## 2024-04-24 ENCOUNTER — Encounter: Payer: Self-pay | Admitting: Internal Medicine

## 2024-04-24 VITALS — BP 148/92 | HR 74 | Temp 98.3°F | Ht 74.0 in | Wt 283.2 lb

## 2024-04-24 DIAGNOSIS — I10 Essential (primary) hypertension: Secondary | ICD-10-CM

## 2024-04-24 MED ORDER — OLMESARTAN MEDOXOMIL-HCTZ 40-25 MG PO TABS: 1.0000 | ORAL_TABLET | Freq: Every day | ORAL | 0 refills | Status: DC

## 2024-04-24 NOTE — Progress Notes (Signed)
 Established Patient Office Visit  Subjective:  Patient ID: Nicholas Solis, male    DOB: 19-Jul-1976  Age: 48 y.o. MRN: [id number]  Chief Complaint  Patient presents with   Follow-up    3 week follow up    No new complaints, here for BP follow up andd medication refills.     No other concerns at this time.   No past medical history on file.  No past surgical history on file.  Social History   Socioeconomic History   Marital status: Married    Spouse name: Not on file   Number of children: 4   Years of education: Not on file   Highest education level: High school graduate  Occupational History   Occupation: [employer] doctor  Tobacco Use   Smoking status: Some Days    Types: Cigarettes   Smokeless tobacco: Never  Vaping Use   Vaping status: Never Used  Substance and Sexual Activity   Alcohol use: Yes    Alcohol/week: 6.0 standard drinks of alcohol    Types: 6 Cans of beer per week   Drug use: Never   Sexual activity: Yes  Other Topics Concern   Not on file  Social History Narrative   Not on file   Social Drivers of Health   Financial Resource Strain: Not on file  Food Insecurity: Not on file  Transportation Needs: Not on file  Physical Activity: Not on file  Stress: Not on file  Social Connections: Unknown (12/21/2021)   Received from [facility]   Social Network    Social Network: Not on file  Intimate Partner Violence: Unknown (11/12/2021)   Received from [facility]   HITS    Physically Hurt: Not on file    Insult or Talk Down To: Not on file    Threaten Physical Harm: Not on file    Scream or Curse: Not on file    No family history on file.  No Known Allergies  Outpatient Medications Prior to Visit  Medication Sig   [DISCONTINUED] olmesartan -hydrochlorothiazide  (BENICAR  HCT) 40-12.5 MG tablet Take 1 tablet by mouth daily.   cetirizine (ZYRTEC) 10 MG tablet Take 10 mg by mouth daily. (Patient not taking: Reported on 04/24/2024)    cyclobenzaprine  (FLEXERIL ) 5 MG tablet Take 1-2 tablets (5-10 mg total) by mouth 3 (three) times daily as needed for muscle spasms. (Patient not taking: Reported on 04/24/2024)   fluticasone (FLONASE) 50 MCG/ACT nasal spray Place 1 spray into both nostrils daily. (Patient not taking: Reported on 04/24/2024)   HYDROcodone -acetaminophen  (NORCO) 5-325 MG tablet Take 1 tablet by mouth every 4 (four) hours as needed for moderate pain. (Patient not taking: Reported on 04/24/2024)   HYDROcodone -acetaminophen  (NORCO/VICODIN) 5-325 MG tablet Take 1 tablet by mouth every 4 (four) hours as needed. (Patient not taking: Reported on 04/24/2024)   ibuprofen  (ADVIL ,MOTRIN ) 600 MG tablet Take 1 tablet (600 mg total) by mouth every 6 (six) hours as needed. (Patient not taking: Reported on 04/24/2024)   predniSONE  (DELTASONE ) 10 MG tablet Take 1 tablet (10 mg total) by mouth daily. 6,5,4,3,2,1 six day taper (Patient not taking: Reported on 04/24/2024)   No facility-administered medications prior to visit.    Review of Systems  Constitutional:  Negative for weight loss (gained 2 lbs).  HENT: Negative.    Eyes: Negative.   Respiratory: Negative.    Cardiovascular: Negative.   Gastrointestinal: Negative.   Genitourinary: Negative.   Musculoskeletal:  Positive for joint pain (bilat knees).  Skin:  Negative.   Neurological: Negative.   Endo/Heme/Allergies: Negative.        Objective:   BP (!) 148/92 (Cuff Size: Normal)   Pulse 74   Temp 98.3 F (36.8 C)   Ht 6' 2 (1.88 m)   Wt 283 lb 3.2 oz (128.5 kg)   SpO2 97%   BMI 36.36 kg/m   Vitals:   04/24/24 1534 04/24/24 1553  BP: (!) 146/96 (!) 148/92  Pulse: 74   Temp: 98.3 F (36.8 C)   Height: 6' 2 (1.88 m)   Weight: 283 lb 3.2 oz (128.5 kg)   SpO2: 97%   BMI (Calculated): 36.35     Physical Exam Vitals reviewed.  Constitutional:      Appearance: Normal appearance. He is obese.  HENT:     Head: Normocephalic.     Left Ear: There is no impacted  cerumen.     Nose: Nose normal.     Mouth/Throat:     Mouth: Mucous membranes are moist.     Pharynx: No posterior oropharyngeal erythema.  Eyes:     Extraocular Movements: Extraocular movements intact.     Pupils: Pupils are equal, round, and reactive to light.  Cardiovascular:     Rate and Rhythm: Regular rhythm.     Chest Wall: PMI is not displaced.     Pulses: Normal pulses.     Heart sounds: Normal heart sounds. No murmur heard. Pulmonary:     Effort: Pulmonary effort is normal.     Breath sounds: Normal air entry. No rhonchi or rales.  Abdominal:     General: Abdomen is flat. Bowel sounds are normal. There is no distension.     Palpations: Abdomen is soft. There is no hepatomegaly, splenomegaly or mass.     Tenderness: There is no abdominal tenderness.  Musculoskeletal:        General: Normal range of motion.     Cervical back: Normal range of motion and neck supple.     Right lower leg: No edema.     Left lower leg: No edema.  Skin:    General: Skin is warm and dry.  Neurological:     General: No focal deficit present.     Mental Status: He is alert and oriented to person, place, and time.     Cranial Nerves: No cranial nerve deficit.     Motor: No weakness.  Psychiatric:        Mood and Affect: Mood normal.        Behavior: Behavior normal.      No results found for any visits on 04/24/24.  No results found for this or any previous visit (from the past 2160 hours).    Assessment & Plan:  Nicholas Solis was seen today for follow-up.  Primary hypertension -     Olmesartan  Medoxomil-HCTZ; Take 1 tablet by mouth daily.  Dispense: 30 tablet; Refill: 0    Problem List Items Addressed This Visit       Cardiovascular and Mediastinum   Primary hypertension - Primary   Relevant Medications   olmesartan -hydrochlorothiazide  (BENICAR  HCT) 40-25 MG tablet    Return in about 3 weeks (around 05/15/2024) for BP followup.   Total time spent: 20 minutes  Sherrill Cinderella Perry, MD  04/24/2024   This document may have been prepared by [facility] Voice Recognition software and as such may include unintentional dictation errors.

## 2024-05-14 ENCOUNTER — Ambulatory Visit: Admitting: Internal Medicine

## 2024-08-06 ENCOUNTER — Ambulatory Visit: Admitting: Internal Medicine

## 2024-08-06 ENCOUNTER — Encounter: Payer: Self-pay | Admitting: Internal Medicine

## 2024-08-06 VITALS — BP 139/82 | HR 76 | Temp 99.0°F | Ht 74.0 in | Wt 294.8 lb

## 2024-08-06 DIAGNOSIS — E782 Mixed hyperlipidemia: Secondary | ICD-10-CM

## 2024-08-06 DIAGNOSIS — I10 Essential (primary) hypertension: Secondary | ICD-10-CM | POA: Diagnosis not present

## 2024-08-06 DIAGNOSIS — N209 Urinary calculus, unspecified: Secondary | ICD-10-CM

## 2024-08-06 LAB — POCT URINALYSIS DIPSTICK
Bilirubin, UA: NEGATIVE
Blood, UA: NEGATIVE
Glucose, UA: NEGATIVE
Ketones, UA: NEGATIVE
Leukocytes, UA: NEGATIVE
Nitrite, UA: NEGATIVE
Protein, UA: NEGATIVE
Spec Grav, UA: 1.01
Urobilinogen, UA: 0.2 U/dL
pH, UA: 6

## 2024-08-06 MED ORDER — OLMESARTAN MEDOXOMIL-HCTZ 40-25 MG PO TABS: 1.0000 | ORAL_TABLET | Freq: Every day | ORAL | 0 refills | Status: DC

## 2024-08-06 NOTE — Progress Notes (Signed)
 "  Established Patient Office Visit  Subjective:  Patient ID: Nicholas Solis, male    DOB: November 26, 1975  Age: 48 y.o. MRN: [id number]  Chief Complaint  Patient presents with   Follow-up    Routine follow up    C/o intermittent colicky pain in his right flank, quite severe last episode yesterday and noticed that it started after drinking mushroom coffee two months ago. here for medication refills. BP well controlled despite weight gain.     No other concerns at this time.   No past medical history on file.  No past surgical history on file.  Social History   Socioeconomic History   Marital status: Married    Spouse name: Not on file   Number of children: 4   Years of education: Not on file   Highest education level: High school graduate  Occupational History   Occupation: [employer] Doctor  Tobacco Use   Smoking status: Some Days    Types: Cigarettes   Smokeless tobacco: Never  Vaping Use   Vaping status: Never Used  Substance and Sexual Activity   Alcohol use: Yes    Alcohol/week: 6.0 standard drinks of alcohol    Types: 6 Cans of beer per week   Drug use: Never   Sexual activity: Yes  Other Topics Concern   Not on file  Social History Narrative   Not on file   Social Drivers of Health   Tobacco Use: High Risk (08/06/2024)   Patient History    Smoking Tobacco Use: Some Days    Smokeless Tobacco Use: Never    Passive Exposure: Not on file  Financial Resource Strain: Not on file  Food Insecurity: Not on file  Transportation Needs: Not on file  Physical Activity: Not on file  Stress: Not on file  Social Connections: Unknown (12/21/2021)   Received from [facility]   Social Network    Social Network: Not on file  Intimate Partner Violence: Unknown (11/12/2021)   Received from [facility]   HITS    Physically Hurt: Not on file    Insult or Talk Down To: Not on file    Threaten Physical Harm: Not on file    Scream or Curse: Not on file  Depression  (PHQ2-9): Low Risk (07/11/2023)   Depression (PHQ2-9)    PHQ-2 Score: 0  Alcohol Screen: Not on file  Housing: Not on file  Utilities: Not on file  Health Literacy: Not on file    No family history on file.  Allergies[1]  Show/hide medication list[2]  Review of Systems  Constitutional:  Negative for weight loss (11 lbs wt gain).  HENT: Negative.    Eyes: Negative.   Respiratory: Negative.    Cardiovascular: Negative.   Gastrointestinal: Negative.   Genitourinary: Negative.   Musculoskeletal:  Positive for joint pain (bilat knees).  Skin: Negative.   Neurological: Negative.   Endo/Heme/Allergies: Negative.        Objective:   BP 139/82   Pulse 76   Temp 99 F (37.2 C)   Ht 6' 2 (1.88 m)   Wt 294 lb 12.8 oz (133.7 kg)   SpO2 96%   BMI 37.85 kg/m   Vitals:   08/06/24 1500  BP: 139/82  Pulse: 76  Temp: 99 F (37.2 C)  Height: 6' 2 (1.88 m)  Weight: 294 lb 12.8 oz (133.7 kg)  SpO2: 96%  BMI (Calculated): 37.83    Physical Exam Vitals reviewed.  Constitutional:  Appearance: Normal appearance. He is obese.  HENT:     Head: Normocephalic.     Left Ear: There is no impacted cerumen.     Nose: Nose normal.     Mouth/Throat:     Mouth: Mucous membranes are moist.     Pharynx: No posterior oropharyngeal erythema.  Eyes:     Extraocular Movements: Extraocular movements intact.     Pupils: Pupils are equal, round, and reactive to light.  Cardiovascular:     Rate and Rhythm: Regular rhythm.     Chest Wall: PMI is not displaced.     Pulses: Normal pulses.     Heart sounds: Normal heart sounds. No murmur heard. Pulmonary:     Effort: Pulmonary effort is normal.     Breath sounds: Normal air entry. No rhonchi or rales.  Abdominal:     General: Abdomen is flat. Bowel sounds are normal. There is no distension.     Palpations: Abdomen is soft. There is no hepatomegaly, splenomegaly or mass.     Tenderness: There is no abdominal tenderness.   Musculoskeletal:        General: Normal range of motion.     Cervical back: Normal range of motion and neck supple.     Right lower leg: No edema.     Left lower leg: No edema.  Skin:    General: Skin is warm and dry.  Neurological:     General: No focal deficit present.     Mental Status: He is alert and oriented to person, place, and time.     Cranial Nerves: No cranial nerve deficit.     Motor: No weakness.  Psychiatric:        Mood and Affect: Mood normal.        Behavior: Behavior normal.      Results for orders placed or performed in visit on 08/06/24  POCT Urinalysis Dipstick (81002)  Result Value Ref Range   Color, UA     Clarity, UA     Glucose, UA Negative Negative   Bilirubin, UA Negative    Ketones, UA Negative    Spec Grav, UA 1.010 1.010 - 1.025   Blood, UA Negatie    pH, UA 6.0 5.0 - 8.0   Protein, UA Negative Negative   Urobilinogen, UA 0.2 0.2 or 1.0 E.U./dL   Nitrite, UA Negative    Leukocytes, UA Negative Negative   Appearance yellow    Odor      Recent Results (from the past 2160 hours)  POCT Urinalysis Dipstick (18997)     Status: Normal   Collection Time: 08/06/24  3:49 PM  Result Value Ref Range   Color, UA     Clarity, UA     Glucose, UA Negative Negative   Bilirubin, UA Negative    Ketones, UA Negative    Spec Grav, UA 1.010 1.010 - 1.025   Blood, UA Negatie    pH, UA 6.0 5.0 - 8.0   Protein, UA Negative Negative   Urobilinogen, UA 0.2 0.2 or 1.0 E.U./dL   Nitrite, UA Negative    Leukocytes, UA Negative Negative   Appearance yellow    Odor        Assessment & Plan:  Nicholas Solis was seen today for follow-up.  Calculus of upper urinary tract -     POCT urinalysis dipstick  Primary hypertension -     Olmesartan  Medoxomil-HCTZ; Take 1 tablet by mouth daily.  Dispense: 30 tablet; Refill: 0 -  Hemoglobin A1c  Mixed hyperlipidemia -     Lipid panel    Problem List Items Addressed This Visit       Cardiovascular and  Mediastinum   Primary hypertension   Relevant Medications   olmesartan -hydrochlorothiazide  (BENICAR  HCT) 40-25 MG tablet   Other Relevant Orders   Hemoglobin A1c     Other   Mixed hyperlipidemia   Relevant Medications   olmesartan -hydrochlorothiazide  (BENICAR  HCT) 40-25 MG tablet   Other Relevant Orders   Lipid panel   Other Visit Diagnoses       Calculus of upper urinary tract    -  Primary   Relevant Orders   POCT Urinalysis Dipstick (18997) (Completed)       Return in about 3 months (around 11/04/2024) for fu with labs prior.   Total time spent: 30 minutes. This time includes review of previous notes and results and patient face to face interaction during today'Rollande Thursby visit.    Sherrill Cinderella Perry, MD  08/06/2024   This document may have been prepared by [facility] Voice Recognition software and as such may include unintentional dictation errors.     [1] No Known Allergies [2]  Outpatient Medications Prior to Visit  Medication Sig   cetirizine (ZYRTEC) 10 MG tablet Take 10 mg by mouth daily.   fluticasone (FLONASE) 50 MCG/ACT nasal spray Place 1 spray into both nostrils daily.   [DISCONTINUED] olmesartan -hydrochlorothiazide  (BENICAR  HCT) 40-25 MG tablet Take 1 tablet by mouth daily.   [DISCONTINUED] cyclobenzaprine  (FLEXERIL ) 5 MG tablet Take 1-2 tablets (5-10 mg total) by mouth 3 (three) times daily as needed for muscle spasms. (Patient not taking: Reported on 08/06/2024)   [DISCONTINUED] HYDROcodone -acetaminophen  (NORCO) 5-325 MG tablet Take 1 tablet by mouth every 4 (four) hours as needed for moderate pain. (Patient not taking: Reported on 08/06/2024)   [DISCONTINUED] HYDROcodone -acetaminophen  (NORCO/VICODIN) 5-325 MG tablet Take 1 tablet by mouth every 4 (four) hours as needed. (Patient not taking: Reported on 08/06/2024)   [DISCONTINUED] ibuprofen  (ADVIL ,MOTRIN ) 600 MG tablet Take 1 tablet (600 mg total) by mouth every 6 (six) hours as needed. (Patient not taking:  Reported on 08/06/2024)   [DISCONTINUED] predniSONE  (DELTASONE ) 10 MG tablet Take 1 tablet (10 mg total) by mouth daily. 6,5,4,3,2,1 six day taper (Patient not taking: Reported on 08/06/2024)   No facility-administered medications prior to visit.   "

## 2024-08-17 ENCOUNTER — Other Ambulatory Visit: Payer: Self-pay

## 2024-08-17 DIAGNOSIS — I10 Essential (primary) hypertension: Secondary | ICD-10-CM

## 2024-08-17 MED ORDER — OLMESARTAN MEDOXOMIL-HCTZ 40-25 MG PO TABS: 1.0000 | ORAL_TABLET | Freq: Every day | ORAL | 0 refills | Status: DC

## 2024-08-28 ENCOUNTER — Telehealth: Payer: Self-pay

## 2024-08-28 ENCOUNTER — Other Ambulatory Visit: Payer: Self-pay | Admitting: Internal Medicine

## 2024-08-28 DIAGNOSIS — I10 Essential (primary) hypertension: Secondary | ICD-10-CM

## 2024-08-28 MED ORDER — NEBIVOLOL HCL 5 MG PO TABS: 5.0000 mg | ORAL_TABLET | Freq: Every day | ORAL | 2 refills | Status: AC

## 2024-08-28 MED ORDER — OLMESARTAN MEDOXOMIL-HCTZ 40-12.5 MG PO TABS: 1.0000 | ORAL_TABLET | Freq: Every day | ORAL | 0 refills | Status: AC

## 2024-08-28 NOTE — Telephone Encounter (Signed)
Taken care of by elizabeth

## 2024-08-28 NOTE — Telephone Encounter (Signed)
 Pt LM asking for call back about medication

## 2024-11-16 ENCOUNTER — Ambulatory Visit: Admitting: Internal Medicine
